# Patient Record
Sex: Male | Born: 1980 | Race: White | Hispanic: No | Marital: Single | Smoking: Former smoker
Health system: Southern US, Community
[De-identification: ages and names within clinical notes are randomized; demographics above are authoritative.]

## PROBLEM LIST (undated history)

## (undated) DIAGNOSIS — N200 Calculus of kidney: Secondary | ICD-10-CM

## (undated) DIAGNOSIS — I456 Pre-excitation syndrome: Secondary | ICD-10-CM

## (undated) DIAGNOSIS — R002 Palpitations: Secondary | ICD-10-CM

## (undated) DIAGNOSIS — Z720 Tobacco use: Secondary | ICD-10-CM

## (undated) DIAGNOSIS — R079 Chest pain, unspecified: Secondary | ICD-10-CM

## (undated) DIAGNOSIS — F419 Anxiety disorder, unspecified: Secondary | ICD-10-CM

## (undated) DIAGNOSIS — R51 Headache: Secondary | ICD-10-CM

## (undated) DIAGNOSIS — R1310 Dysphagia, unspecified: Secondary | ICD-10-CM

## (undated) DIAGNOSIS — R519 Headache, unspecified: Secondary | ICD-10-CM

## (undated) DIAGNOSIS — H539 Unspecified visual disturbance: Secondary | ICD-10-CM

## (undated) HISTORY — DX: Palpitations: R00.2

## (undated) HISTORY — PX: ESOPHAGOGASTRODUODENOSCOPY: SHX1529

## (undated) HISTORY — DX: Chest pain, unspecified: R07.9

## (undated) HISTORY — DX: Calculus of kidney: N20.0

## (undated) HISTORY — DX: Headache: R51

## (undated) HISTORY — PX: STRABISMUS SURGERY: SHX218

## (undated) HISTORY — DX: Unspecified visual disturbance: H53.9

## (undated) HISTORY — DX: Headache, unspecified: R51.9

---

## 2015-04-29 ENCOUNTER — Emergency Department: Payer: Self-pay

## 2015-04-29 ENCOUNTER — Emergency Department
Admission: EM | Admit: 2015-04-29 | Discharge: 2015-04-29 | Disposition: A | Discharge status: Home / Self Care | Payer: Self-pay | Attending: Emergency Medicine | Admitting: Emergency Medicine

## 2015-04-29 ENCOUNTER — Encounter: Payer: Self-pay | Admitting: Emergency Medicine

## 2015-04-29 DIAGNOSIS — R42 Dizziness and giddiness: Secondary | ICD-10-CM | POA: Insufficient documentation

## 2015-04-29 DIAGNOSIS — R200 Anesthesia of skin: Secondary | ICD-10-CM | POA: Insufficient documentation

## 2015-04-29 DIAGNOSIS — Z720 Tobacco use: Secondary | ICD-10-CM | POA: Insufficient documentation

## 2015-04-29 DIAGNOSIS — R110 Nausea: Secondary | ICD-10-CM | POA: Insufficient documentation

## 2015-04-29 DIAGNOSIS — R079 Chest pain, unspecified: Secondary | ICD-10-CM | POA: Insufficient documentation

## 2015-04-29 DIAGNOSIS — R531 Weakness: Secondary | ICD-10-CM | POA: Insufficient documentation

## 2015-04-29 LAB — URINE DRUG SCREEN, QUALITATIVE (ARMC ONLY)
Amphetamines, Ur Screen: NOT DETECTED
BARBITURATES, UR SCREEN: NOT DETECTED
BENZODIAZEPINE, UR SCRN: NOT DETECTED
CANNABINOID 50 NG, UR ~~LOC~~: NOT DETECTED
Cocaine Metabolite,Ur ~~LOC~~: NOT DETECTED
MDMA (Ecstasy)Ur Screen: NOT DETECTED
METHADONE SCREEN, URINE: NOT DETECTED
Opiate, Ur Screen: NOT DETECTED
Phencyclidine (PCP) Ur S: NOT DETECTED
TRICYCLIC, UR SCREEN: NOT DETECTED

## 2015-04-29 LAB — BASIC METABOLIC PANEL
ANION GAP: 12 (ref 5–15)
BUN: 16 mg/dL (ref 6–20)
CALCIUM: 11.2 mg/dL — AB (ref 8.9–10.3)
CO2: 26 mmol/L (ref 22–32)
Chloride: 102 mmol/L (ref 101–111)
Creatinine, Ser: 0.99 mg/dL (ref 0.61–1.24)
Glucose, Bld: 102 mg/dL — ABNORMAL HIGH (ref 65–99)
POTASSIUM: 3.1 mmol/L — AB (ref 3.5–5.1)
SODIUM: 140 mmol/L (ref 135–145)

## 2015-04-29 LAB — CBC
HCT: 49.1 % (ref 40.0–52.0)
Hemoglobin: 16.8 g/dL (ref 13.0–18.0)
MCH: 32.6 pg (ref 26.0–34.0)
MCHC: 34.2 g/dL (ref 32.0–36.0)
MCV: 95.4 fL (ref 80.0–100.0)
PLATELETS: 201 10*3/uL (ref 150–440)
RBC: 5.15 MIL/uL (ref 4.40–5.90)
RDW: 13.4 % (ref 11.5–14.5)
WBC: 9.9 10*3/uL (ref 3.8–10.6)

## 2015-04-29 LAB — ETHANOL

## 2015-04-29 LAB — TROPONIN I
Troponin I: <0.03 ng/mL (ref <0.031)
Troponin I: <0.03 ng/mL (ref <0.031)

## 2015-04-29 MED ORDER — SODIUM CHLORIDE 0.9 % IV BOLUS (SEPSIS)
500.0000 mL | Freq: Once | INTRAVENOUS | Status: AC
  Administered 2015-04-29: 500 mL via INTRAVENOUS

## 2015-04-29 MED ORDER — ZIPRASIDONE MESYLATE 20 MG IM SOLR
INTRAMUSCULAR | Status: AC
Start: 2015-04-29 — End: 2015-04-30
  Filled 2015-04-29: qty 20

## 2015-04-29 MED ORDER — POTASSIUM CHLORIDE CRYS ER 20 MEQ PO TBCR
40.0000 meq | EXTENDED_RELEASE_TABLET | Freq: Once | ORAL | Status: AC
  Administered 2015-04-29: 40 meq via ORAL
  Filled 2015-04-29: qty 2

## 2015-04-29 MED ORDER — PANTOPRAZOLE SODIUM 40 MG IV SOLR
40.0000 mg | Freq: Once | INTRAVENOUS | Status: AC
  Administered 2015-04-29: 40 mg via INTRAVENOUS
  Filled 2015-04-29: qty 40

## 2015-04-29 NOTE — ED Notes (Signed)
Lab notified to add ethanol, spoke with Jon Steele, states will add.

## 2015-04-29 NOTE — ED Notes (Signed)
Patient transported to X-ray 

## 2015-04-29 NOTE — ED Notes (Signed)
Pt in with co nausea, chest pain, and dizziness since tonight.

## 2015-04-29 NOTE — Discharge Instructions (Signed)
1. Please call number provided to schedule an appointment with the cardiologist. 2. Return to the ER for recurrent or worsening symptoms, persistent vomiting, difficulty breathing or other concerns.  Chest Pain (Nonspecific) It is often hard to give a specific diagnosis for the cause of chest pain. There is always a chance that your pain could be related to something serious, such as a heart attack or a blood clot in the lungs. You need to follow up with your health care provider for further evaluation. CAUSES   Heartburn.  Pneumonia or bronchitis.  Anxiety or stress.  Inflammation around your heart (pericarditis) or lung (pleuritis or pleurisy).  A blood clot in the lung.  A collapsed lung (pneumothorax). It can develop suddenly on its own (spontaneous pneumothorax) or from trauma to the chest.  Shingles infection (herpes zoster virus). The chest wall is composed of bones, muscles, and cartilage. Any of these can be the source of the pain.  The bones can be bruised by injury.  The muscles or cartilage can be strained by coughing or overwork.  The cartilage can be affected by inflammation and become sore (costochondritis). DIAGNOSIS  Lab tests or other studies may be needed to find the cause of your pain. Your health care provider may have you take a test called an ambulatory electrocardiogram (ECG). An ECG records your heartbeat patterns over a 24-hour period. You may also have other tests, such as:  Transthoracic echocardiogram (TTE). During echocardiography, sound waves are used to evaluate how blood flows through your heart.  Transesophageal echocardiogram (TEE).  Cardiac monitoring. This allows your health care provider to monitor your heart rate and rhythm in real time.  Holter monitor. This is a portable device that records your heartbeat and can help diagnose heart arrhythmias. It allows your health care provider to track your heart activity for several days, if  needed.  Stress tests by exercise or by giving medicine that makes the heart beat faster. TREATMENT   Treatment depends on what may be causing your chest pain. Treatment may include:  Acid blockers for heartburn.  Anti-inflammatory medicine.  Pain medicine for inflammatory conditions.  Antibiotics if an infection is present.  You may be advised to change lifestyle habits. This includes stopping smoking and avoiding alcohol, caffeine, and chocolate.  You may be advised to keep your head raised (elevated) when sleeping. This reduces the chance of acid going backward from your stomach into your esophagus. Most of the time, nonspecific chest pain will improve within 2-3 days with rest and mild pain medicine.  HOME CARE INSTRUCTIONS   If antibiotics were prescribed, take them as directed. Finish them even if you start to feel better.  For the next few days, avoid physical activities that bring on chest pain. Continue physical activities as directed.  Do not use any tobacco products, including cigarettes, chewing tobacco, or electronic cigarettes.  Avoid drinking alcohol.  Only take medicine as directed by your health care provider.  Follow your health care provider's suggestions for further testing if your chest pain does not go away.  Keep any follow-up appointments you made. If you do not go to an appointment, you could develop lasting (chronic) problems with pain. If there is any problem keeping an appointment, call to reschedule. SEEK MEDICAL CARE IF:   Your chest pain does not go away, even after treatment.  You have a rash with blisters on your chest.  You have a fever. SEEK IMMEDIATE MEDICAL CARE IF:   You have  increased chest pain or pain that spreads to your arm, neck, jaw, back, or abdomen.  You have shortness of breath.  You have an increasing cough, or you cough up blood.  You have severe back or abdominal pain.  You feel nauseous or vomit.  You have severe  weakness.  You faint.  You have chills. This is an emergency. Do not wait to see if the pain will go away. Get medical help at once. Call your local emergency services (911 in [place] Do not drive yourself to the hospital. MAKE SURE YOU:   Understand these instructions.  Will watch your condition.  Will get help right away if you are not doing well or get worse. Document Released: 04/25/2005 Document Revised: 07/21/2013 Document Reviewed: 02/19/2008 [facility] Patient Information 2015 [place], [place]. This information is not intended to replace advice given to you by your health care provider. Make sure you discuss any questions you have with your health care provider.

## 2015-04-29 NOTE — ED Provider Notes (Signed)
[facility] Emergency Department Provider Note  ____________________________________________  Time seen: Approximately 3:20 AM  I have reviewed the triage vital signs and the nursing notes.   HISTORY  Chief Complaint Chest Pain    HPI Jon Steele is a 34 y.o. male who presents to the ED from home with a chief complain of chest pain. Onset of left-sided chest discomfort approximately midnight while working at his computer. Symptoms associated with nausea and dizziness. Similar symptoms previously which were not evaluated. Patient also notes some heartburn this evening. Denies recent fever, chills, cough, congestion, abdominal pain, vomiting, diarrhea. Denies recent travel or illicit drug use. Denies hormone use. Denies use of energy drinks or stimulants. Denies recent strenuous exercise or use of muscle supplements. Nothing makes the discomfort better or worse.  Past medical history None   There are no active problems to display for this patient.   Past surgical history Eye surgery   No current outpatient prescriptions on file.  Allergies Review of patient's allergies indicates no known allergies.  Family history None for CAD Maternal grandmother with diabetes   Social History Social History  Substance Use Topics  . Smoking status: Current Every Day Smoker -- 1.00 packs/day    Types: Cigarettes  . Smokeless tobacco: Never Used  . Alcohol Use: None     Comment: daily, beers    Review of Systems Constitutional: No fever/chills Eyes: No visual changes. ENT: No sore throat. Cardiovascular: Positive for chest pain. Respiratory: Denies shortness of breath. Gastrointestinal: No abdominal pain.  Positive for nausea, no vomiting.  No diarrhea.  No constipation. Genitourinary: Negative for dysuria. Musculoskeletal: Negative for back pain. Skin: Negative for rash. Neurological: Positive for dizziness, especially upon standing quickly. Negative  for headaches, focal weakness or numbness.  10-point ROS otherwise negative.  ____________________________________________   PHYSICAL EXAM:  VITAL SIGNS: ED Triage Vitals  Enc Vitals Group     BP 04/29/15 0222 167/93 mmHg     Pulse Rate 04/29/15 0222 98     Resp 04/29/15 0222 18     Temp 04/29/15 0222 98.4 F (36.9 C)     Temp Source 04/29/15 0222 Oral     SpO2 04/29/15 0222 97 %     Weight 04/29/15 0222 160 lb (72.576 kg)     Height 04/29/15 0222  (1.778 m)     Head Cir --      Peak Flow --      Pain Score 04/29/15 0223 2     Pain Loc --      Pain Edu? --      Excl. in GC? --     Constitutional: Alert and oriented. Well appearing and in no acute distress. Eyes: Conjunctivae are normal. PERRL. EOMI. Head: Atraumatic. Nose: No congestion/rhinnorhea. Mouth/Throat: Mucous membranes are moist.  Oropharynx non-erythematous. Neck: No stridor.   Cardiovascular: Normal rate, regular rhythm. Grossly normal heart sounds.  Good peripheral circulation. Respiratory: Normal respiratory effort.  No retractions. Lungs CTAB. Gastrointestinal: Soft and nontender. No distention. No abdominal bruits. No CVA tenderness. Musculoskeletal: No lower extremity tenderness nor edema.  No joint effusions. Neurologic:  Normal speech and language. No gross focal neurologic deficits are appreciated. No gait instability. Skin:  Skin is warm, dry and intact. No rash noted. Psychiatric: Mood and affect are normal. Speech and behavior are normal.  ____________________________________________   LABS (all labs ordered are listed, but only abnormal results are displayed)  Labs Reviewed  BASIC METABOLIC PANEL - Abnormal; Notable for  the following:    Potassium 3.1 (*)    Glucose, Bld 102 (*)    Calcium 11.2 (*)    All other components within normal limits  CBC  TROPONIN I  ETHANOL  URINE DRUG SCREEN, QUALITATIVE ([facility] ONLY)  TROPONIN I    ____________________________________________  EKG  ED ECG REPORT I, SUNG,JADE J, the attending physician, personally viewed and interpreted this ECG.   Date: 04/29/2015  EKG Time: 0227  Rate: 85  Rhythm: normal EKG, normal sinus rhythm  Axis: Normal  Intervals:none  ST&T Change: Nonspecific  ____________________________________________  RADIOLOGY  Chest 2 view (viewed by me, interpreted per Dr. Karie Kirks): Normal chest. ____________________________________________   PROCEDURES  Procedure(s) performed: None  Critical Care performed: No  ____________________________________________   INITIAL IMPRESSION / ASSESSMENT AND PLAN / ED COURSE  Pertinent labs & imaging results that were available during my care of the patient were reviewed by me and considered in my medical decision making (see chart for details).  34 year old male who presents with chest discomfort. Patient has no past medical history and there is no family history of CAD; low risk for ACS. Low suspicion for PE given no recent travel, hypoxia or calf tenderness. Will obtain screening lab work including troponin, EKG, chest x-ray and reassess. Will give IV Protonix for sensation of reflux.  ----------------------------------------- 4:29 AM on 04/29/2015 -----------------------------------------  Patient resting in no acute distress. Feeling better. Will repeat 3 hour troponin. Remains hemodynamically stable.  ----------------------------------------- 6:08 AM on 04/29/2015 -----------------------------------------  Repeat troponin negative. Discussed with patient and father; will follow-up cardiology outpatient. Strict return return precautions given. Both verbalize understanding and agree with plan of care. ____________________________________________   FINAL CLINICAL IMPRESSION(S) / ED DIAGNOSES  Final diagnoses:  Chest pain, unspecified chest pain type      Irean Hong, MD 04/29/15 [id number]

## 2015-06-09 ENCOUNTER — Emergency Department: Payer: Self-pay

## 2015-06-09 ENCOUNTER — Encounter: Payer: Self-pay | Admitting: Emergency Medicine

## 2015-06-09 ENCOUNTER — Observation Stay
Admission: EM | Admit: 2015-06-09 | Discharge: 2015-06-10 | Disposition: A | Discharge status: Home / Self Care | Payer: Self-pay | Attending: Internal Medicine | Admitting: Internal Medicine

## 2015-06-09 ENCOUNTER — Observation Stay
Admit: 2015-06-09 | Discharge: 2015-06-09 | Disposition: A | Discharge status: Home / Self Care | Payer: Self-pay | Attending: Cardiology | Admitting: Cardiology

## 2015-06-09 DIAGNOSIS — I071 Rheumatic tricuspid insufficiency: Secondary | ICD-10-CM | POA: Insufficient documentation

## 2015-06-09 DIAGNOSIS — I10 Essential (primary) hypertension: Secondary | ICD-10-CM | POA: Insufficient documentation

## 2015-06-09 DIAGNOSIS — I456 Pre-excitation syndrome: Secondary | ICD-10-CM | POA: Insufficient documentation

## 2015-06-09 DIAGNOSIS — K219 Gastro-esophageal reflux disease without esophagitis: Secondary | ICD-10-CM | POA: Insufficient documentation

## 2015-06-09 DIAGNOSIS — R0602 Shortness of breath: Secondary | ICD-10-CM | POA: Insufficient documentation

## 2015-06-09 DIAGNOSIS — R079 Chest pain, unspecified: Principal | ICD-10-CM | POA: Insufficient documentation

## 2015-06-09 DIAGNOSIS — R5383 Other fatigue: Secondary | ICD-10-CM | POA: Insufficient documentation

## 2015-06-09 DIAGNOSIS — Z832 Family history of diseases of the blood and blood-forming organs and certain disorders involving the immune mechanism: Secondary | ICD-10-CM | POA: Insufficient documentation

## 2015-06-09 DIAGNOSIS — F17210 Nicotine dependence, cigarettes, uncomplicated: Secondary | ICD-10-CM | POA: Insufficient documentation

## 2015-06-09 DIAGNOSIS — F419 Anxiety disorder, unspecified: Secondary | ICD-10-CM | POA: Insufficient documentation

## 2015-06-09 DIAGNOSIS — R011 Cardiac murmur, unspecified: Secondary | ICD-10-CM | POA: Insufficient documentation

## 2015-06-09 DIAGNOSIS — Z79899 Other long term (current) drug therapy: Secondary | ICD-10-CM | POA: Insufficient documentation

## 2015-06-09 HISTORY — DX: Tobacco use: Z72.0

## 2015-06-09 HISTORY — DX: Pre-excitation syndrome: I45.6

## 2015-06-09 HISTORY — DX: Dysphagia, unspecified: R13.10

## 2015-06-09 HISTORY — DX: Anxiety disorder, unspecified: F41.9

## 2015-06-09 LAB — BASIC METABOLIC PANEL
Anion gap: 7 (ref 5–15)
BUN: 16 mg/dL (ref 6–20)
CALCIUM: 10 mg/dL (ref 8.9–10.3)
CHLORIDE: 101 mmol/L (ref 101–111)
CO2: 30 mmol/L (ref 22–32)
CREATININE: 0.96 mg/dL (ref 0.61–1.24)
GFR calc non Af Amer: >60 mL/min (ref >60)
Glucose, Bld: 103 mg/dL — ABNORMAL HIGH (ref 65–99)
Potassium: 3.4 mmol/L — ABNORMAL LOW (ref 3.5–5.1)
SODIUM: 138 mmol/L (ref 135–145)

## 2015-06-09 LAB — FIBRIN DERIVATIVES D-DIMER (ARMC ONLY): Fibrin derivatives D-dimer (ARMC): 179 (ref 0–499)

## 2015-06-09 LAB — CBC
HCT: 46.6 % (ref 40.0–52.0)
HEMOGLOBIN: 15.9 g/dL (ref 13.0–18.0)
MCH: 32.2 pg (ref 26.0–34.0)
MCHC: 34.2 g/dL (ref 32.0–36.0)
MCV: 94.2 fL (ref 80.0–100.0)
Platelets: 169 10*3/uL (ref 150–440)
RBC: 4.94 MIL/uL (ref 4.40–5.90)
RDW: 12.7 % (ref 11.5–14.5)
WBC: 6.2 10*3/uL (ref 3.8–10.6)

## 2015-06-09 LAB — TROPONIN I

## 2015-06-09 MED ORDER — ENOXAPARIN SODIUM 40 MG/0.4ML ~~LOC~~ SOLN
40.0000 mg | SUBCUTANEOUS | Status: DC
  Administered 2015-06-09 – 2015-06-10 (×2): 40 mg via SUBCUTANEOUS
  Filled 2015-06-09 (×4): qty 0.4

## 2015-06-09 MED ORDER — POLYETHYLENE GLYCOL 3350 17 G PO PACK: 17.0000 g | PACK | Freq: Every day | ORAL | Status: DC | PRN

## 2015-06-09 MED ORDER — ONDANSETRON HCL 4 MG PO TABS: 4.0000 mg | ORAL_TABLET | Freq: Four times a day (QID) | ORAL | Status: DC | PRN

## 2015-06-09 MED ORDER — ACETAMINOPHEN 650 MG RE SUPP: 650.0000 mg | Freq: Four times a day (QID) | RECTAL | Status: DC | PRN

## 2015-06-09 MED ORDER — ONDANSETRON HCL 4 MG/2ML IJ SOLN: 4.0000 mg | Freq: Four times a day (QID) | INTRAMUSCULAR | Status: DC | PRN

## 2015-06-09 MED ORDER — DOCUSATE SODIUM 100 MG PO CAPS
100.0000 mg | ORAL_CAPSULE | Freq: Two times a day (BID) | ORAL | Status: DC
Start: 2015-06-09 — End: 2015-06-10
  Administered 2015-06-09 – 2015-06-10 (×3): 100 mg via ORAL
  Filled 2015-06-09 (×3): qty 1

## 2015-06-09 MED ORDER — ACETAMINOPHEN 325 MG PO TABS: 650.0000 mg | ORAL_TABLET | Freq: Four times a day (QID) | ORAL | Status: DC | PRN

## 2015-06-09 MED ORDER — SODIUM CHLORIDE 0.9 % IV SOLN: 250.0000 mL | INTRAVENOUS | Status: DC | PRN

## 2015-06-09 MED ORDER — SODIUM CHLORIDE 0.9 % IJ SOLN: 3.0000 mL | INTRAMUSCULAR | Status: DC | PRN

## 2015-06-09 MED ORDER — SODIUM CHLORIDE 0.9 % IJ SOLN
3.0000 mL | Freq: Two times a day (BID) | INTRAMUSCULAR | Status: DC
  Administered 2015-06-09: 3 mL via INTRAVENOUS

## 2015-06-09 MED ORDER — ALBUTEROL SULFATE (2.5 MG/3ML) 0.083% IN NEBU: 2.5000 mg | INHALATION_SOLUTION | RESPIRATORY_TRACT | Status: DC | PRN

## 2015-06-09 MED ORDER — ASPIRIN EC 81 MG PO TBEC
81.0000 mg | DELAYED_RELEASE_TABLET | Freq: Every day | ORAL | Status: DC
  Administered 2015-06-10: 81 mg via ORAL
  Filled 2015-06-09: qty 1

## 2015-06-09 MED ORDER — SODIUM CHLORIDE 0.9 % IJ SOLN
3.0000 mL | Freq: Two times a day (BID) | INTRAMUSCULAR | Status: DC
  Administered 2015-06-09 – 2015-06-10 (×2): 3 mL via INTRAVENOUS

## 2015-06-09 MED ORDER — ASPIRIN 81 MG PO CHEW
324.0000 mg | CHEWABLE_TABLET | Freq: Once | ORAL | Status: AC
  Administered 2015-06-09: 324 mg via ORAL
  Filled 2015-06-09: qty 4

## 2015-06-09 MED ORDER — HYDROCODONE-ACETAMINOPHEN 5-325 MG PO TABS
1.0000 | ORAL_TABLET | ORAL | Status: DC | PRN
  Administered 2015-06-09: 1 via ORAL
  Administered 2015-06-09 – 2015-06-10 (×4): 2 via ORAL
  Filled 2015-06-09 (×3): qty 2
  Filled 2015-06-09: qty 1
  Filled 2015-06-09: qty 2

## 2015-06-09 NOTE — Consult Note (Signed)
[facility] Cardiology  CARDIOLOGY CONSULT NOTE  Patient ID: Kinta Martis MRN: [id number] DOB/AGE: 34-Oct-1982 34 y.o.  Admit date: 06/09/2015 Referring Physician Sudini Primary Physician Reather Littler Primary Cardiologist  Reason for Consultation chest pain  HPI: 34 year old male referred for evaluation of chest pain. Patient apparently was in his usual state of health until approximately 6 weeks ago when he started to experience episodes of substernal chest discomfort, with radiation to his left shoulder and arm, associated with weakness, shortness of breath, apically with exertion but also occurring after meals. She was seen in [facility] emergency room, and was evaluated by Dr. Juliann Pares as an outpatient. The patient was scheduled for outpatient echocardiogram and stress test which was not performed. In the meantime, the patient has undergone barium swallow and upper endoscopy, with biopsy, results pending. Patient returned today to the emergency room with current chest discomfort following a shower. EKG reveals sinus rhythm with short PR interval and delta wave, consistent with Wolff-Parkinson-White syndrome. The patient denies a history of presyncope or syncope though occasionally does experience palpitations.  Review of systems complete and found to be negative unless listed above     Past Medical History  Diagnosis Date  . Tobacco abuse   . Dysphagia   . Hypertension   . Anxiety     Past Surgical History  Procedure Laterality Date  . Strabismus surgery      Prescriptions prior to admission  Medication Sig Dispense Refill Last Dose  . atenolol (TENORMIN) 25 MG tablet Take 25 mg by mouth every evening.   06/08/2015 at 1820  . busPIRone (BUSPAR) 10 MG tablet Take 10 mg by mouth daily.   06/08/2015 at am  . pantoprazole (PROTONIX) 40 MG tablet Take 40 mg by mouth 2 (two) times daily before a meal.   06/08/2015 at pm   Social History   Social History  . Marital Status: Single    Spouse Name: N/A   . Number of Children: N/A  . Years of Education: N/A   Occupational History  . Not on file.   Social History Main Topics  . Smoking status: Current Every Day Smoker -- 1.00 packs/day    Types: Cigarettes  . Smokeless tobacco: Never Used  . Alcohol Use: Not on file     Comment: daily, beers  . Drug Use: No  . Sexual Activity: Yes   Other Topics Concern  . Not on file   Social History Narrative    Family History  Problem Relation Age of Onset  . Clotting disorder Father       Review of systems complete and found to be negative unless listed above      PHYSICAL EXAM  General: Well developed, well nourished, in no acute distress HEENT:  Normocephalic and atramatic Neck:  No JVD.  Lungs: Clear bilaterally to auscultation and percussion. Heart: HRRR . Normal S1 and S2 without gallops or murmurs.  Abdomen: Bowel sounds are positive, abdomen soft and non-tender  Msk:  Back normal, normal gait. Normal strength and tone for age. Extremities: No clubbing, cyanosis or edema.   Neuro: Alert and oriented X 3. Psych:  Good affect, responds appropriately  Labs:   Lab Results  Component Value Date   WBC 6.2 06/09/2015   HGB 15.9 06/09/2015   HCT 46.6 06/09/2015   MCV 94.2 06/09/2015   PLT 169 06/09/2015    Recent Labs Lab 06/09/15 0601  NA 138  K 3.4*  CL 101  CO2 30  BUN 16  CREATININE 0.96  CALCIUM 10.0  GLUCOSE 103*   Lab Results  Component Value Date   TROPONINI <0.03 06/09/2015   No results found for: CHOL No results found for: HDL No results found for: LDLCALC No results found for: TRIG No results found for: CHOLHDL No results found for: LDLDIRECT    Radiology: Dg Chest 2 View  06/09/2015  CLINICAL DATA:  Initial evaluation for acute left-sided chest pain, shortness of breath. EXAM: CHEST  2 VIEW COMPARISON:  Prior radiograph from 05/18/2015. FINDINGS: The cardiac and mediastinal silhouettes are stable in size and contour, and remain within normal  limits. The lungs are normally inflated. No airspace consolidation, pleural effusion, or pulmonary edema is identified. There is no pneumothorax. No acute osseous abnormality identified. IMPRESSION: No active cardiopulmonary disease. Electronically Signed   By: Rise MuBenjamin  McClintock M.D.   On: 06/09/2015 06:01    EKG: Normal sinus rhythm with short PR interval and delta wave consistent with Wolff-Parkinson-White  ASSESSMENT AND PLAN:   1. Recurrent episodes of exertional chest pain with shortness of breath 2. ECG consistent with Wolff-Parkinson-White syndrome  Recommendations  1. 2-D echocardiogram 2. ETT sestamibi study in the a.m. 3. Defer full dose anticoagulation   Signed: Tiffnay Bossi MD,PhD, [facility] 06/09/2015, 1:32 PM

## 2015-06-09 NOTE — H&P (Signed)
[facility] - [place] at [facility]   PATIENT NAME: Jon Steele    MR#:  [id number]  DATE OF BIRTH:  12-Oct-1980  DATE OF ADMISSION:  06/09/2015  PRIMARY CARE PHYSICIAN: Pricilla Holm, MD   REQUESTING/REFERRING PHYSICIAN: Dr. Inocencio Homes  CHIEF COMPLAINT:   Chief Complaint  Patient presents with  . Shortness of Breath    HISTORY OF PRESENT ILLNESS:  Jon Steele  is a 34 y.o. male with a known history of retention, GERD presents to the emergency room complaining of left chest pain radiating to left arm. Symptoms started 6 weeks prior along with shortness of breath. Extreme fatigue. Symptoms have been slowly worsening. No nausea, lightheadedness, syncope, palpitations. Patient was seen by Dr. Juliann Pares of cardiology and scheduled for an outpatient stress test. Patient was also seen by GI and neurology and had endoscopy which was normal. Biopsy results pending. Due to worsening symptoms patient is being admitted under observation to rule out acute coronary syndrome. Kartik enzymes normal.  PAST MEDICAL HISTORY:  History reviewed. No pertinent past medical history.  PAST SURGICAL HISTORY:   Past Surgical History  Procedure Laterality Date  . Strabismus surgery      SOCIAL HISTORY:   Social History  Substance Use Topics  . Smoking status: Current Every Day Smoker -- 1.00 packs/day    Types: Cigarettes  . Smokeless tobacco: Never Used  . Alcohol Use: Not on file     Comment: daily, beers    FAMILY HISTORY:  No family history on file.  DRUG ALLERGIES:  No Known Allergies  REVIEW OF SYSTEMS:   Review of Systems  Constitutional: Positive for malaise/fatigue. Negative for fever, chills and weight loss.  HENT: Negative for hearing loss and nosebleeds.   Eyes: Negative for blurred vision, double vision and pain.  Respiratory: Negative for cough, hemoptysis, sputum production, shortness of breath and wheezing.   Cardiovascular: Positive for chest  pain. Negative for palpitations, orthopnea and leg swelling.  Gastrointestinal: Positive for abdominal pain. Negative for nausea, vomiting, diarrhea and constipation.  Genitourinary: Negative for dysuria and hematuria.  Musculoskeletal: Negative for myalgias, back pain and falls.  Skin: Negative for rash.  Neurological: Positive for weakness. Negative for dizziness, tremors, sensory change, speech change, focal weakness, seizures and headaches.  Endo/Heme/Allergies: Does not bruise/bleed easily.  Psychiatric/Behavioral: Negative for depression and memory loss. The patient is nervous/anxious.     MEDICATIONS AT HOME:   Prior to Admission medications   Medication Sig Start Date End Date Taking? Authorizing Provider  atenolol (TENORMIN) 25 MG tablet Take 25 mg by mouth every evening.   Yes Historical Provider, MD  busPIRone (BUSPAR) 10 MG tablet Take 10 mg by mouth daily.   Yes Historical Provider, MD  pantoprazole (PROTONIX) 40 MG tablet Take 40 mg by mouth 2 (two) times daily before a meal.   Yes Historical Provider, MD      VITAL SIGNS:  Blood pressure 117/67, pulse 63, temperature 98.1 F (36.7 C), temperature source Oral, resp. rate 14, height  (1.778 m), weight 72.576 kg (160 lb), SpO2 100 %.  PHYSICAL EXAMINATION:  Physical Exam  GENERAL:  34 y.o.-year-old patient lying in the bed with no acute distress.  EYES: Pupils equal, round, reactive to light and accommodation. No scleral icterus. Extraocular muscles intact.  HEENT: Head atraumatic, normocephalic. Oropharynx and nasopharynx clear. No oropharyngeal erythema, moist oral mucosa  NECK:  Supple, no jugular venous distention. No thyroid enlargement, no tenderness.  LUNGS: Normal breath sounds  bilaterally, no wheezing, rales, rhonchi. No use of accessory muscles of respiration. Left chest tenderness  CARDIOVASCULAR: S1, S2 normal. No murmurs, rubs, or gallops.  ABDOMEN: Soft, nontender, nondistended. Bowel sounds present.  No organomegaly or mass.  EXTREMITIES: No pedal edema, cyanosis, or clubbing. + 2 pedal & radial pulses b/l.   NEUROLOGIC: Cranial nerves II through XII are intact. No focal Motor or sensory deficits appreciated b/l PSYCHIATRIC: The patient is alert and oriented x 3. Good affect.  SKIN: No obvious rash, lesion, or ulcer.   LABORATORY PANEL:   CBC  Recent Labs Lab 06/09/15 0601  WBC 6.2  HGB 15.9  HCT 46.6  PLT 169   ------------------------------------------------------------------------------------------------------------------  Chemistries   Recent Labs Lab 06/09/15 0601  NA 138  K 3.4*  CL 101  CO2 30  GLUCOSE 103*  BUN 16  CREATININE 0.96  CALCIUM 10.0   ------------------------------------------------------------------------------------------------------------------  Cardiac Enzymes  Recent Labs Lab 06/09/15 0601  TROPONINI <0.03   ------------------------------------------------------------------------------------------------------------------  RADIOLOGY:  Dg Chest 2 View  06/09/2015  CLINICAL DATA:  Initial evaluation for acute left-sided chest pain, shortness of breath. EXAM: CHEST  2 VIEW COMPARISON:  Prior radiograph from 05/18/2015. FINDINGS: The cardiac and mediastinal silhouettes are stable in size and contour, and remain within normal limits. The lungs are normally inflated. No airspace consolidation, pleural effusion, or pulmonary edema is identified. There is no pneumothorax. No acute osseous abnormality identified. IMPRESSION: No active cardiopulmonary disease. Electronically Signed   By: Rise MuBenjamin  McClintock M.D.   On: 06/09/2015 06:01     IMPRESSION AND PLAN:   * Left chest pain Etiology is not clear at this point. Associated with shortness of breath. Patient has been on PPI and also had EGD which were normal. Cardiac enzymes normal. Discussed with Dr. Adrienne MochaParaschoes of cardiology. May need stress test.  * Anxiety  * GERD  * DVT  prophylaxis with Lovenox  All the records are reviewed and case discussed with ED provider. Management plans discussed with the patient, family and they are in agreement.  CODE STATUS: FULL  TOTAL TIME TAKING CARE OF THIS PATIENT: 40 minutes.    Milagros LollSudini, Wilton Thrall R M.D on 06/09/2015 at 12:28 PM  Between 7am to 6pm - Pager - [phone]  After 6pm go to [email or web address] - password [email or web address] [email or web address]  [facility]  Office  [phone]  CC: Primary care physician; Pricilla HolmSHARPE, LESLIE M, MD  Note: This dictation was prepared with Dragon dictation along with smaller phrase technology. Any transcriptional errors that result from this process are unintentional.

## 2015-06-09 NOTE — ED Notes (Signed)
Patient ambulatory to triage with steady gait, without difficulty or distress noted; pt reports awakening with "cool warmness and shortness of breath"; denies pain; st hx of same and has been referred to GI with subsequent tests

## 2015-06-09 NOTE — Progress Notes (Signed)
*  PRELIMINARY RESULTS* Echocardiogram 2D Echocardiogram has been performed.  Jon Steele 06/09/2015, 4:12 PM

## 2015-06-09 NOTE — ED Provider Notes (Signed)
[facility] Emergency Department Provider Note  ____________________________________________  Time seen: Approximately 7:16 AM  I have reviewed the triage vital signs and the nursing notes.   HISTORY  Chief Complaint Shortness of Breath    HPI Jon Steele is a 34 y.o. male with history of hypertension, tobacco use who presents for evaluation of chest pain and shortness of breath this morning, gradual onset, constant since onset, worse with exertion, currently mild. She reports that his pain often radiates into the left arm. He has had worsening dyspnea is with exertion over the past several weeks, now feeling winded just with routine activities such as showering. He was seen by Dr. Juliann Pares several weeks ago for chest pain and was noted to have a murmur on exam. stress test and echocardiogram were recommended due to concern for possible unstable angina however have not been performed. He has undergone extensive GI workup for this without a specific GI source found. No cough, sneezing, runny nose, congestion, vomiting, diarrhea, fevers or chills.   History reviewed. No pertinent past medical history.  Patient Active Problem List   Diagnosis Date Noted  . Chest pain 06/09/2015    Past Surgical History  Procedure Laterality Date  . Strabismus surgery      Current Outpatient Rx  Name  Route  Sig  Dispense  Refill  . atenolol (TENORMIN) 25 MG tablet   Oral   Take 25 mg by mouth every evening.         . busPIRone (BUSPAR) 10 MG tablet   Oral   Take 10 mg by mouth daily.         . pantoprazole (PROTONIX) 40 MG tablet   Oral   Take 40 mg by mouth 2 (two) times daily before a meal.           Allergies Review of patient's allergies indicates no known allergies.  No family history on file.  Social History Social History  Substance Use Topics  . Smoking status: Current Every Day Smoker -- 1.00 packs/day    Types: Cigarettes  . Smokeless  tobacco: Never Used  . Alcohol Use: None     Comment: daily, beers    Review of Systems Constitutional: No fever/chills Eyes: No visual changes. ENT: No sore throat. Cardiovascular: +chest pain. Respiratory: + shortness of breath. Gastrointestinal: No abdominal pain.  No nausea, no vomiting.  No diarrhea.  No constipation. Genitourinary: Negative for dysuria. Musculoskeletal: Negative for back pain. Skin: Negative for rash. Neurological: Negative for headaches, focal weakness or numbness.  10-point ROS otherwise negative.  ____________________________________________   PHYSICAL EXAM:  VITAL SIGNS: ED Triage Vitals  Enc Vitals Group     BP 06/09/15 0539 129/82 mmHg     Pulse Rate 06/09/15 0539 86     Resp 06/09/15 0539 18     Temp 06/09/15 0539 98 F (36.7 C)     Temp Source 06/09/15 0539 Oral     SpO2 06/09/15 0539 100 %     Weight 06/09/15 0539 160 lb (72.576 kg)     Height 06/09/15 0539  (1.778 m)     Head Cir --      Peak Flow --      Pain Score --      Pain Loc --      Pain Edu? --      Excl. in GC? --     Constitutional: Alert and oriented. Well appearing and in no acute distress. Eyes: Conjunctivae are  normal. PERRL. EOMI. Head: Atraumatic. Nose: No congestion/rhinnorhea. Mouth/Throat: Mucous membranes are moist.  Oropharynx non-erythematous. Neck: No stridor.  Cardiovascular: Normal rate, regular rhythm. Grossly normal heart sounds.  Good peripheral circulation. Respiratory: Normal respiratory effort.  No retractions. Lungs CTAB. Gastrointestinal: Soft and nontender. No distention. No CVA tenderness. Genitourinary: deferred Musculoskeletal: No lower extremity tenderness nor edema.  No joint effusions. Neurologic:  Normal speech and language. No gross focal neurologic deficits are appreciated. No gait instability. Skin:  Skin is warm, dry and intact. No rash noted. Psychiatric: Mood and affect are normal. Speech and behavior are  normal.  ____________________________________________   LABS (all labs ordered are listed, but only abnormal results are displayed)  Labs Reviewed  BASIC METABOLIC PANEL - Abnormal; Notable for the following:    Potassium 3.4 (*)    Glucose, Bld 103 (*)    All other components within normal limits  CBC  TROPONIN I  TROPONIN I  TROPONIN I   ____________________________________________  EKG  ED ECG REPORT I, Gayla DossGayle, Dashun Borre A, the attending physician, personally viewed and interpreted this ECG.   Date: 06/09/2015  EKG Time: 05:45  Rate: 78  Rhythm: normal sinus rhythm  Axis: normal  Intervals:none, WPW , ventricular pre-excitation  ST&T Change: no acute ST elevation   ____________________________________________  RADIOLOGY  CXR IMPRESSION: No active cardiopulmonary disease. ____________________________________________   PROCEDURES  Procedure(s) performed: None  Critical Care performed: No  ____________________________________________   INITIAL IMPRESSION / ASSESSMENT AND PLAN / ED COURSE  Pertinent labs & imaging results that were available during my care of the patient were reviewed by me and considered in my medical decision making (see chart for details).  Jon Steele is a 34 y.o. male with history of hypertension, tobacco use who presents for evaluation of chest pain and shortness of breath this morning, gradual onset, constant since onset, worse with exertion, currently mild. On exam, he is generally well-appearing and in no acute distress. Vital signs stable, he is afebrile. He has a benign examination. I'm unable to appreciate the murmur that Dr. Juliann Parescallwood  appreciated on his examination in clinic. EKG today shows Wolff-Parkinson-White type B pattern which was not previously appreciated on prior EKGs. Labs reviewed and first troponin is negative however my concern for this patient is for possible ACS given worsening dyspnea on exertion, chest pain.  Additionally, in the setting of new EKG findings, history of murmur, no prior echo or provocative testing, I believe he warrants admission at this time for further inpatient workup. I discussed the case with Dr. Darrold JunkerParaschos whois in agreement with the plan. I discussed the case with the hospitalist, Dr. Loni MuseKate Walsh at 8:15 am she will admit the patient. Aspirin given. ____________________________________________   FINAL CLINICAL IMPRESSION(S) / ED DIAGNOSES  Final diagnoses:  Chest pain, unspecified chest pain type  SOB (shortness of breath) on exertion      Gayla DossEryka A Regenia Erck, MD 06/09/15 [phone]

## 2015-06-10 ENCOUNTER — Observation Stay: Payer: Self-pay

## 2015-06-10 ENCOUNTER — Encounter: Payer: Self-pay | Admitting: Internal Medicine

## 2015-06-10 LAB — NM MYOCAR MULTI W/SPECT W/WALL MOTION / EF
CHL CUP NUCLEAR SSS: 0
LV dias vol: 73 mL
LVSYSVOL: 23 mL
NUC STRESS TID: 0.95
SDS: 0
SRS: 0

## 2015-06-10 MED ORDER — IOHEXOL 350 MG/ML SOLN: 75.0000 mL | Freq: Once | INTRAVENOUS | Status: DC | PRN

## 2015-06-10 MED ORDER — BUSPIRONE HCL 5 MG PO TABS
10.0000 mg | ORAL_TABLET | Freq: Every day | ORAL | Status: DC
  Administered 2015-06-10: 10 mg via ORAL
  Filled 2015-06-10: qty 2

## 2015-06-10 MED ORDER — TECHNETIUM TC 99M SESTAMIBI GENERIC - CARDIOLITE
31.6300 | Freq: Once | INTRAVENOUS | Status: AC | PRN
  Administered 2015-06-10: 31.63 via INTRAVENOUS

## 2015-06-10 MED ORDER — POTASSIUM CHLORIDE CRYS ER 20 MEQ PO TBCR
40.0000 meq | EXTENDED_RELEASE_TABLET | Freq: Once | ORAL | Status: AC
  Administered 2015-06-10: 40 meq via ORAL
  Filled 2015-06-10: qty 2

## 2015-06-10 MED ORDER — TECHNETIUM TC 99M SESTAMIBI GENERIC - CARDIOLITE
13.7000 | Freq: Once | INTRAVENOUS | Status: AC | PRN
  Administered 2015-06-10: 13.7 via INTRAVENOUS

## 2015-06-10 MED ORDER — IOHEXOL 300 MG/ML  SOLN
75.0000 mL | Freq: Once | INTRAMUSCULAR | Status: AC | PRN
  Administered 2015-06-10: 75 mL via INTRAVENOUS

## 2015-06-10 MED ORDER — PANTOPRAZOLE SODIUM 40 MG PO TBEC
40.0000 mg | DELAYED_RELEASE_TABLET | Freq: Two times a day (BID) | ORAL | Status: DC
  Administered 2015-06-10: 40 mg via ORAL
  Filled 2015-06-10: qty 1

## 2015-06-10 NOTE — Progress Notes (Signed)
Initial Nutrition Assessment   INTERVENTION:   Meals and Snacks: Cater to patient preferences Medical Food Supplement Therapy: will recommend on follow if intake poor. RD notes pt was drinking [employer] Breakfast PTA Coordination of Care: will continue to assess for malnutrition using guidelines from AND/ASPEN as pt out of room on visit   NUTRITION DIAGNOSIS:   Inadequate oral intake related to altered GI function as evidenced by per patient/family report.  GOAL:   Patient will meet greater than or equal to 90% of their needs  MONITOR:    (Energy Intake, Anthropometrics, digestive system, Electrolyte and renal Profile)  REASON FOR ASSESSMENT:   Malnutrition Screening Tool    ASSESSMENT:   Pt admitted with CP. Pt out of room on visit for CT, father present in room.  Past Medical History  Diagnosis Date  . Tobacco abuse   . Dysphagia   . Hypertension   . Anxiety   . WPW (Wolff-Parkinson-White syndrome)      Diet Order:  Diet regular Room service appropriate?: Yes; Fluid consistency:: Thin Diet general    Current Nutrition: Father reports pt ate 100% of sandwich today without difficulty. Pt father reports medications have eased chest pain and pt has been able to eat regular foods since admission without difficulty.   Food/Nutrition-Related History: Pt father reports pt has been eating eggs, pears, and Carnation Instant Breakfast (up to 6 per day) only for the past 3 weeks PTA secondary to dysphagia and chest tightness. RD notes each Carn[employer] Instant Breakfast provides approximately 300kcals and 13g protein.   Scheduled Medications:  . aspirin EC  81 mg Oral Daily  . busPIRone  10 mg Oral Daily  . docusate sodium  100 mg Oral BID  . enoxaparin (LOVENOX) injection  40 mg Subcutaneous Q24H  . pantoprazole  40 mg Oral BID AC  . sodium chloride  3 mL Intravenous Q12H  . sodium chloride  3 mL Intravenous Q12H     Electrolyte/Renal Profile and Glucose  Profile:   Recent Labs Lab 06/09/15 0601  NA 138  K 3.4*  CL 101  CO2 30  BUN 16  CREATININE 0.96  CALCIUM 10.0  GLUCOSE 103*   Protein Profile: No results for input(s): ALBUMIN in the last 168 hours.  Gastrointestinal Profile: Last BM:  06/08/2015   Nutrition-Focused Physical Exam Findings:  Unable to complete Nutrition-Focused physical exam at this time.    Weight Change: Per father pt has lost 10-12lbs in the past 6 weeks (7% in 6 weeks)   Height:   Ht Readings from Last 1 Encounters:  06/09/15 5\' 10"  (1.778 m)    Weight:   Wt Readings from Last 1 Encounters:  06/09/15 155 lb 8 oz (70.534 kg)    BMI:  Body mass index is 22.31 kg/(m^2).   Estimated Nutritional Needs:   Kcal:  BEE: 1646kcals, TEE: (IF 1.1-1.3)(AF 1.3) 2354-2782kcals  Protein:  56-78g protein (0.8-1.0g/kg)  Fluid:  1762-211615mL of fluid (25-6830ml/kg)   EDUCATION NEEDS:   Education needs no appropriate at this time  HIGH Care Level  Leda QuailAllyson Ly Bacchi, RD, LDN Pager [phone]

## 2015-06-10 NOTE — Care Management Note (Signed)
Case Management Note  Patient Details  Name: Jon Steele MRN: [id number] Date of Birth: 07/08/1981  Subjective/Objective:                 Patient presents from home with chest pain.  Patient lives at home with Dad.  Patient currently does not have a source of income and has not health insurance.  Patient states that he uses [facility] on [address] to obtain his medication.  He has been able to afford his medications out of pocket thus far.  Patient states that he has a source of transportation.  Patient states that he currently has a PCP and would like to stay in their practice and pay out of pocket.  I provided patient with application to [facility].  Patient states that he as contacted [facility] assistance [facility].    Action/Plan: Monitor for medication needs a time of discharge.    Expected Discharge Date:                  Expected Discharge Plan:     In-House Referral:     Discharge planning Services     Post Acute Care Choice:    Choice offered to:     DME Arranged:    DME Agency:     HH Arranged:    HH Agency:     Status of Service:     Medicare Important Message Given:    Date Medicare IM Given:    Medicare IM give by:    Date Additional Medicare IM Given:    Additional Medicare Important Message give by:     If discussed at Long Length of Stay Meetings, dates discussed:    Additional Comments:  Chapman FitchBOWEN, Tiwana Chavis T, RN 06/10/2015, 12:03 PM

## 2015-06-10 NOTE — Discharge Instructions (Addendum)
°  DIET:  Regular diet  DISCHARGE CONDITION:  Stable  ACTIVITY:  Activity as tolerated  OXYGEN:  Home Oxygen: No.   Oxygen Delivery: room air  DISCHARGE LOCATION:  home   If you experience worsening of your admission symptoms, develop shortness of breath, life threatening emergency, suicidal or homicidal thoughts you must seek medical attention immediately by calling 911 or calling your MD immediately  if symptoms less severe.  You Must read complete instructions/literature along with all the possible adverse reactions/side effects for all the Medicines you take and that have been prescribed to you. Take any new Medicines after you have completely understood and accpet all the possible adverse reactions/side effects.   Please note  You were cared for by a hospitalist during your hospital stay. If you have any questions about your discharge medications or the care you received while you were in the hospital after you are discharged, you can call the unit and asked to speak with the hospitalist on call if the hospitalist that took care of you is not available. Once you are discharged, your primary care physician will handle any further medical issues. Please note that NO REFILLS for any discharge medications will be authorized once you are discharged, as it is imperative that you return to your primary care physician (or establish a relationship with a primary care physician if you do not have one) for your aftercare needs so that they can reassess your need for medications and monitor your lab values.   You will need a Pulmonary function study ordered by your family doctor.

## 2015-06-13 NOTE — Discharge Summary (Signed)
[facility] [place] [facility]   PATIENT NAME: Jon BellowsMatthew Steele    MR#:  [id number]  DATE OF BIRTH:  02/28/1981  DATE OF ADMISSION:  06/09/2015 ADMITTING PHYSICIAN: Milagros LollSrikar Farris Geiman, MD  DATE OF DISCHARGE: 06/10/2015  6:31 PM  PRIMARY CARE PHYSICIAN: Pricilla HolmSHARPE, LESLIE M, MD    ADMISSION DIAGNOSIS:  SOB (shortness of breath) on exertion [R06.02] Chest pain, unspecified chest pain type [R07.9]  DISCHARGE DIAGNOSIS:  Active Problems:   Chest pain   SECONDARY DIAGNOSIS:   Past Medical History  Diagnosis Date  . Tobacco abuse   . Dysphagia   . Hypertension   . Anxiety   . WPW (Wolff-Parkinson-White syndrome)      ADMITTING HISTORY  Jon Steele is a 34 y.o. male with a known history of retention, GERD presents to the emergency room complaining of left chest pain radiating to left arm. Symptoms started 6 weeks prior along with shortness of breath. Extreme fatigue. Symptoms have been slowly worsening. No nausea, lightheadedness, syncope, palpitations. Patient was seen by Dr. Juliann Paresallwood of cardiology and scheduled for an outpatient stress test. Patient was also seen by GI and neurology and had endoscopy which was normal. Biopsy results pending. Due to worsening symptoms patient is being admitted under observation to rule out acute coronary syndrome. Kartik enzymes normal.   HOSPITAL COURSE:   Patient admitted to tele floor due to CP. Atypical symptoms. seen by cardiology Dr. Adrienne MochaParaschoes. Cardiac enzymes were normal. Telemetry showed no arrhythmias. Patient had nuclear stress test which is normal. Echocardiogram showed no valvular lesions. No wall motion abnormalities. A d-dimer was checked which was normal. But due to ongoing pleuritic chest pain patient had a CTA of the chest which showed no pulmonary embolism. Nothing else acute found. At this point due to patient's ongoing symptoms of shortness of breath exertion I have recommended that he get an  outpatient pulmonary function test with his primary care physician. Patient was extremely frustrated as a final diagnosis could not be found during his admission. All questions answered to patient's satisfaction and requested to follow-up with his primary care physician. Vitals stable at discharge and patient ambulating well.   CONSULTS OBTAINED:  Treatment Team:  Marcina MillardAlexander Paraschos, MD  DRUG ALLERGIES:  No Known Allergies  DISCHARGE MEDICATIONS:   Discharge Medication List as of 06/10/2015  5:59 PM    CONTINUE these medications which have NOT CHANGED   Details  atenolol (TENORMIN) 25 MG tablet Take 25 mg by mouth every evening., Until Discontinued, Historical Med    busPIRone (BUSPAR) 10 MG tablet Take 10 mg by mouth daily., Until Discontinued, Historical Med    pantoprazole (PROTONIX) 40 MG tablet Take 40 mg by mouth 2 (two) times daily before a meal., Until Discontinued, Historical Med         Today    VITAL SIGNS:  Blood pressure 98/63, pulse 67, temperature 97.5 F (36.4 C), temperature source Oral, resp. rate 19, height 5\' 10"  (1.778 m), weight 70.534 kg (155 lb 8 oz), SpO2 99 %.  I/O:  No intake or output data in the 24 hours ending 06/13/15 2314  PHYSICAL EXAMINATION:  Physical Exam  GENERAL:  34 y.o.-year-old patient lying in the bed with no acute distress.  LUNGS: Normal breath sounds bilaterally, no wheezing, rales,rhonchi or crepitation. No use of accessory muscles of respiration.  CARDIOVASCULAR: S1, S2 normal. No murmurs, rubs, or gallops.  ABDOMEN: Soft, non-tender, non-distended. Bowel sounds present. No organomegaly or mass.  NEUROLOGIC: Moves all  4 extremities. PSYCHIATRIC: The patient is alert and oriented x 3.  SKIN: No obvious rash, lesion, or ulcer.   DATA REVIEW:   CBC  Recent Labs Lab 06/09/15 0601  WBC 6.2  HGB 15.9  HCT 46.6  PLT 169    Chemistries   Recent Labs Lab 06/09/15 0601  NA 138  K 3.4*  CL 101  CO2 30  GLUCOSE  103*  BUN 16  CREATININE 0.96  CALCIUM 10.0    Cardiac Enzymes  Recent Labs Lab 06/09/15 2029  TROPONINI <0.03    Microbiology Results  No results found for this or any previous visit.  RADIOLOGY:  No results found.    Follow up with PCP in 1 week.  Management plans discussed with the patient, family and they are in agreement.  CODE STATUS:   TOTAL TIME TAKING CARE OF THIS PATIENT ON DAY OF DISCHARGE: more than 30 minutes.    Milagros Loll R M.D on 06/13/2015 at 11:14 PM  Between 7am to 6pm - Pager - [phone]  After 6pm go to [email or web address] - password [email or web address] [facility]  [facility]  Office  [phone]  CC: Primary care physician; Pricilla Holm, MD     Note: This dictation was prepared with Dragon dictation along with smaller phrase technology. Any transcriptional errors that result from this process are unintentional.

## 2015-06-23 ENCOUNTER — Emergency Department: Payer: Self-pay

## 2015-06-23 ENCOUNTER — Emergency Department
Admission: EM | Admit: 2015-06-23 | Discharge: 2015-06-23 | Disposition: A | Discharge status: Home / Self Care | Payer: Self-pay | Attending: Emergency Medicine | Admitting: Emergency Medicine

## 2015-06-23 DIAGNOSIS — Z79899 Other long term (current) drug therapy: Secondary | ICD-10-CM | POA: Insufficient documentation

## 2015-06-23 DIAGNOSIS — M79602 Pain in left arm: Secondary | ICD-10-CM | POA: Insufficient documentation

## 2015-06-23 DIAGNOSIS — F17210 Nicotine dependence, cigarettes, uncomplicated: Secondary | ICD-10-CM | POA: Insufficient documentation

## 2015-06-23 DIAGNOSIS — R0602 Shortness of breath: Secondary | ICD-10-CM | POA: Insufficient documentation

## 2015-06-23 DIAGNOSIS — R0789 Other chest pain: Secondary | ICD-10-CM

## 2015-06-23 DIAGNOSIS — I10 Essential (primary) hypertension: Secondary | ICD-10-CM | POA: Insufficient documentation

## 2015-06-23 DIAGNOSIS — R42 Dizziness and giddiness: Secondary | ICD-10-CM | POA: Insufficient documentation

## 2015-06-23 DIAGNOSIS — R079 Chest pain, unspecified: Secondary | ICD-10-CM | POA: Insufficient documentation

## 2015-06-23 LAB — BASIC METABOLIC PANEL
Anion gap: 5 (ref 5–15)
BUN: 15 mg/dL (ref 6–20)
CHLORIDE: 105 mmol/L (ref 101–111)
CO2: 29 mmol/L (ref 22–32)
Calcium: 9.4 mg/dL (ref 8.9–10.3)
Creatinine, Ser: 0.97 mg/dL (ref 0.61–1.24)
GFR calc non Af Amer: >60 mL/min (ref >60)
Glucose, Bld: 90 mg/dL (ref 65–99)
POTASSIUM: 3.8 mmol/L (ref 3.5–5.1)
SODIUM: 139 mmol/L (ref 135–145)

## 2015-06-23 LAB — CBC
HEMATOCRIT: 43 % (ref 40.0–52.0)
HEMOGLOBIN: 14.6 g/dL (ref 13.0–18.0)
MCH: 31.8 pg (ref 26.0–34.0)
MCHC: 34 g/dL (ref 32.0–36.0)
MCV: 93.7 fL (ref 80.0–100.0)
Platelets: 196 10*3/uL (ref 150–440)
RBC: 4.59 MIL/uL (ref 4.40–5.90)
RDW: 12.7 % (ref 11.5–14.5)
WBC: 6.5 10*3/uL (ref 3.8–10.6)

## 2015-06-23 LAB — TROPONIN I: Troponin I: <0.03 ng/mL (ref <0.031)

## 2015-06-23 MED ORDER — SUCRALFATE 1 G PO TABS: 1.0000 g | ORAL_TABLET | Freq: Four times a day (QID) | ORAL | Status: DC

## 2015-06-23 MED ORDER — GI COCKTAIL ~~LOC~~
30.0000 mL | Freq: Once | ORAL | Status: AC
  Administered 2015-06-23: 30 mL via ORAL
  Filled 2015-06-23: qty 30

## 2015-06-23 NOTE — Discharge Instructions (Signed)
Nonspecific Chest Pain  °Chest pain can be caused by many different conditions. There is always a chance that your pain could be related to something serious, such as a heart attack or a blood clot in your lungs. Chest pain can also be caused by conditions that are not life-threatening. If you have chest pain, it is very important to follow up with your health care provider. °CAUSES  °Chest pain can be caused by: °· Heartburn. °· Pneumonia or bronchitis. °· Anxiety or stress. °· Inflammation around your heart (pericarditis) or lung (pleuritis or pleurisy). °· A blood clot in your lung. °· A collapsed lung (pneumothorax). It can develop suddenly on its own (spontaneous pneumothorax) or from trauma to the chest. °· Shingles infection (varicella-zoster virus). °· Heart attack. °· Damage to the bones, muscles, and cartilage that make up your chest wall. This can include: °¨ Bruised bones due to injury. °¨ Strained muscles or cartilage due to frequent or repeated coughing or overwork. °¨ Fracture to one or more ribs. °¨ Sore cartilage due to inflammation (costochondritis). °RISK FACTORS  °Risk factors for chest pain may include: °· Activities that increase your risk for trauma or injury to your chest. °· Respiratory infections or conditions that cause frequent coughing. °· Medical conditions or overeating that can cause heartburn. °· Heart disease or family history of heart disease. °· Conditions or health behaviors that increase your risk of developing a blood clot. °· Having had chicken pox (varicella zoster). °SIGNS AND SYMPTOMS °Chest pain can feel like: °· Burning or tingling on the surface of your chest or deep in your chest. °· Crushing, pressure, aching, or squeezing pain. °· Dull or sharp pain that is worse when you move, cough, or take a deep breath. °· Pain that is also felt in your back, neck, shoulder, or arm, or pain that spreads to any of these areas. °Your chest pain may come and go, or it may stay  constant. °DIAGNOSIS °Lab tests or other studies may be needed to find the cause of your pain. Your health care provider may have you take a test called an ambulatory ECG (electrocardiogram). An ECG records your heartbeat patterns at the time the test is performed. You may also have other tests, such as: °· Transthoracic echocardiogram (TTE). During echocardiography, sound waves are used to create a picture of all of the heart structures and to look at how blood flows through your heart. °· Transesophageal echocardiogram (TEE). This is a more advanced imaging test that obtains images from inside your body. It allows your health care provider to see your heart in finer detail. °· Cardiac monitoring. This allows your health care provider to monitor your heart rate and rhythm in real time. °· Holter monitor. This is a portable device that records your heartbeat and can help to diagnose abnormal heartbeats. It allows your health care provider to track your heart activity for several days, if needed. °· Stress tests. These can be done through exercise or by taking medicine that makes your heart beat more quickly. °· Blood tests. °· Imaging tests. °TREATMENT  °Your treatment depends on what is causing your chest pain. Treatment may include: °· Medicines. These may include: °¨ Acid blockers for heartburn. °¨ Anti-inflammatory medicine. °¨ Pain medicine for inflammatory conditions. °¨ Antibiotic medicine, if an infection is present. °¨ Medicines to dissolve blood clots. °¨ Medicines to treat coronary artery disease. °· Supportive care for conditions that do not require medicines. This may include: °¨ Resting. °¨ Applying heat   or cold packs to injured areas. °¨ Limiting activities until pain decreases. °HOME CARE INSTRUCTIONS °· If you were prescribed an antibiotic medicine, finish it all even if you start to feel better. °· Avoid any activities that bring on chest pain. °· Do not use any tobacco products, including  cigarettes, chewing tobacco, or electronic cigarettes. If you need help quitting, ask your health care provider. °· Do not drink alcohol. °· Take medicines only as directed by your health care provider. °· Keep all follow-up visits as directed by your health care provider. This is important. This includes any further testing if your chest pain does not go away. °· If heartburn is the cause for your chest pain, you may be told to keep your head raised (elevated) while sleeping. This reduces the chance that acid will go from your stomach into your esophagus. °· Make lifestyle changes as directed by your health care provider. These may include: °¨ Getting regular exercise. Ask your health care provider to suggest some activities that are safe for you. °¨ Eating a heart-healthy diet. A registered dietitian can help you to learn healthy eating options. °¨ Maintaining a healthy weight. °¨ Managing diabetes, if necessary. °¨ Reducing stress. °SEEK MEDICAL CARE IF: °· Your chest pain does not go away after treatment. °· You have a rash with blisters on your chest. °· You have a fever. °SEEK IMMEDIATE MEDICAL CARE IF:  °· Your chest pain is worse. °· You have an increasing cough, or you cough up blood. °· You have severe abdominal pain. °· You have severe weakness. °· You faint. °· You have chills. °· You have sudden, unexplained chest discomfort. °· You have sudden, unexplained discomfort in your arms, back, neck, or jaw. °· You have shortness of breath at any time. °· You suddenly start to sweat, or your skin gets clammy. °· You feel nauseous or you vomit. °· You suddenly feel light-headed or dizzy. °· Your heart begins to beat quickly, or it feels like it is skipping beats. °These symptoms may represent a serious problem that is an emergency. Do not wait to see if the symptoms will go away. Get medical help right away. Call your local emergency services (911 in the [place] Do not drive yourself to the hospital. °  °This  information is not intended to replace advice given to you by your health care provider. Make sure you discuss any questions you have with your health care provider. °  °Document Released: 04/25/2005 Document Revised: 08/06/2014 Document Reviewed: 02/19/2014 °Elsevier Interactive Patient Education ©2016 [employer] ° °Please return immediately if condition worsens. Please contact her primary physician or the physician you were given for referral. If you have any specialist physicians involved in her treatment and plan please also contact them. Thank you for using [facility]. ° °

## 2015-06-23 NOTE — ED Provider Notes (Signed)
Time Seen: Approximately 1717  I have reviewed the triage notes  Chief Complaint: Chest Pain   History of Present Illness: Jon Steele is a 34 y.o. male who presents with repeat episodes of left-sided chest discomfort with a deep fluttering sensation with some shortness of breath and lightheadedness. And states he feels occasional pressure in his left arm. He's had extensive evaluations from a gastrointestinal and also a cardiovascular standpoint. Patient's had a normal echocardiogram. He had an upper GI evaluation with an upcoming swallowing study. He does have a history of gastroesophageal reflux disease. He denies any fever, chills or productive cough. States his discomforts been intermittent over the last 3-4 days.   Past Medical History  Diagnosis Date  . Tobacco abuse   . Dysphagia   . Hypertension   . Anxiety   . WPW (Wolff-Parkinson-White syndrome)     Patient Active Problem List   Diagnosis Date Noted  . Chest pain 06/09/2015    Past Surgical History  Procedure Laterality Date  . Strabismus surgery      Past Surgical History  Procedure Laterality Date  . Strabismus surgery      Current Outpatient Rx  Name  Route  Sig  Dispense  Refill  . atenolol (TENORMIN) 25 MG tablet   Oral   Take 25 mg by mouth every evening.         . busPIRone (BUSPAR) 10 MG tablet   Oral   Take 10 mg by mouth daily.         . pantoprazole (PROTONIX) 40 MG tablet   Oral   Take 40 mg by mouth 2 (two) times daily before a meal.         . sucralfate (CARAFATE) 1 G tablet   Oral   Take 1 tablet (1 g total) by mouth 4 (four) times daily.   28 tablet   1     Allergies:  Review of patient's allergies indicates no known allergies.  Family History: Family History  Problem Relation Age of Onset  . Clotting disorder Father     Social History: Social History  Substance Use Topics  . Smoking status: Current Every Day Smoker -- 1.00 packs/day    Types: Cigarettes  .  Smokeless tobacco: Never Used  . Alcohol Use: None     Comment: daily, beers     Review of Systems:   10 point review of systems was performed and was otherwise negative:  Constitutional: No fever Eyes: No visual disturbances ENT: No sore throat, ear pain Cardiac: Vague left-sided chest discomfort not necessarily pruritic pleuritic or reproducible with movement. No orthopnea. This occasionally the discomfort worse at night and first thing in the morning. Respiratory: No shortness of breath, wheezing, or stridor Abdomen: No abdominal pain, no vomiting, No diarrhea Endocrine: No weight loss, No night sweats Extremities: No peripheral edema, cyanosis Skin: No rashes, easy bruising Neurologic: No focal weakness, trouble with speech or swollowing Urologic: No dysuria, Hematuria, or urinary frequency   Physical Exam:  ED Triage Vitals  Enc Vitals Group     BP 06/23/15 1636 121/70 mmHg     Pulse Rate 06/23/15 1634 68     Resp 06/23/15 1634 18     Temp 06/23/15 1634 98.5 F (36.9 C)     Temp Source 06/23/15 1634 Oral     SpO2 06/23/15 1634 99 %     Weight 06/23/15 1634 155 lb (70.308 kg)     Height 06/23/15 1634  (  1.778 m)     Head Cir --      Peak Flow --      Pain Score 06/23/15 1634 5     Pain Loc --      Pain Edu? --      Excl. in GC? --     General: Awake , Alert , and Oriented times 3; GCS 15 Head: Normal cephalic , atraumatic Eyes: Pupils equal , round, reactive to light Nose/Throat: No nasal drainage, patent upper airway without erythema or exudate.  Neck: Supple, Full range of motion, No anterior adenopathy or palpable thyroid masses Lungs: Clear to ascultation without wheezes , rhonchi, or rales Heart: Regular rate, regular rhythm without murmurs , gallops , or rubs Abdomen: Soft, non tender without rebound, guarding , or rigidity; bowel sounds positive and symmetric in all 4 quadrants. No organomegaly .        Extremities: 2 plus symmetric pulses. No  edema, clubbing or cyanosis Neurologic: normal ambulation, Motor symmetric without deficits, sensory intact Skin: warm, dry, no rashes   Labs:   All laboratory work was reviewed including any pertinent negatives or positives listed below:  Labs Reviewed  BASIC METABOLIC PANEL  CBC  TROPONIN I   review of laboratory work was within normal limits  EKG:  ED ECG REPORT I, Jennye MoccasinBrian S Lizvet Chunn, the attending physician, personally viewed and interpreted this ECG.  Date: 06/23/2015 EKG Time: 1635 Rate: 73 Rhythm: normal sinus rhythm QRS Axis: normal Intervals: No PR interval ST/T Wave abnormalities: normal Conduction Disutrbances: none Narrative Interpretation: unremarkable No significant change  Radiology: * Final result by Rad Results In Interface (06/23/15 17:04:02)   Narrative:   CLINICAL DATA: Chest pain with shortness of breath  EXAM: CHEST 2 VIEW  COMPARISON: Chest radiograph June 09, 2015 and chest CT June 10, 2015  FINDINGS: Lungs are clear. Heart size and pulmonary vascularity are normal. No adenopathy. No pneumothorax. No bone lesions. There is spina bifida occulta at C7.  IMPRESSION: No edema or consolidation.         I personally reviewed the radiologic studies     ED Course: * Patient's also had a recent chest CT done earlier this month which did not show any abnormalities per radiology read. No evidence of pulmonary embolism and I would suspect his discomfort being slightly more persistent. I advised him I was not sure of the nature of his discomfort but did not appear to be a life-threatening etiology at this time and continue with outpatient management. Given a GI cocktail with really no symptomatic improvement. I would suspect if it was cardiovascular since it's been going on for last 3-4 days and something abnormal would show up in either his laboratory work, his EKG, or echocardiogram that he had previously.   Assessment Acute  exacerbation of atypical chest pain   Final Clinical Impression:  * Final diagnoses:  Chest pain of uncertain etiology     Plan: Outpatient management Patient was advised to return immediately if condition worsens. Patient was advised to follow up with her primary care physician or other specialized physicians involved and in their current assessment. Patient was advised take Tylenol over-the-counter for pain and also was given prescription for Carafate and copies of all his laboratory work here in emergency department. She has not upcoming appointment with a new primary physician and his gathering all his data for outpatient evaluation.            Jennye MoccasinBrian S Leina Babe, MD 06/23/15 [phone]

## 2015-06-23 NOTE — ED Notes (Signed)
MD at bedside. 

## 2015-06-23 NOTE — ED Notes (Signed)
EKG at 1633, not 1433

## 2015-06-23 NOTE — ED Notes (Signed)
Pt states that he felt CP approx 1 hour ago and has continued. States CP began at rest. Admitted X 2 weeks ago for similar symptoms. Pt has followup on Monday with cardiology. Pt also reports back pain, shoulder pain, SOB, weakness with the CP. Pt ambulatory. Color WNL.

## 2015-06-26 ENCOUNTER — Emergency Department: Payer: Self-pay

## 2015-06-26 ENCOUNTER — Emergency Department
Admission: EM | Admit: 2015-06-26 | Discharge: 2015-06-26 | Disposition: A | Discharge status: Home / Self Care | Payer: Self-pay | Attending: Emergency Medicine | Admitting: Emergency Medicine

## 2015-06-26 ENCOUNTER — Encounter: Payer: Self-pay | Admitting: Emergency Medicine

## 2015-06-26 DIAGNOSIS — F17210 Nicotine dependence, cigarettes, uncomplicated: Secondary | ICD-10-CM | POA: Insufficient documentation

## 2015-06-26 DIAGNOSIS — R109 Unspecified abdominal pain: Secondary | ICD-10-CM

## 2015-06-26 DIAGNOSIS — I10 Essential (primary) hypertension: Secondary | ICD-10-CM | POA: Insufficient documentation

## 2015-06-26 DIAGNOSIS — Z79899 Other long term (current) drug therapy: Secondary | ICD-10-CM | POA: Insufficient documentation

## 2015-06-26 DIAGNOSIS — N201 Calculus of ureter: Secondary | ICD-10-CM | POA: Insufficient documentation

## 2015-06-26 LAB — COMPREHENSIVE METABOLIC PANEL
ALBUMIN: 4.5 g/dL (ref 3.5–5.0)
ALT: 38 U/L (ref 17–63)
AST: 25 U/L (ref 15–41)
Alkaline Phosphatase: 77 U/L (ref 38–126)
Anion gap: 6 (ref 5–15)
BUN: 14 mg/dL (ref 6–20)
CHLORIDE: 104 mmol/L (ref 101–111)
CO2: 29 mmol/L (ref 22–32)
Calcium: 9.6 mg/dL (ref 8.9–10.3)
Creatinine, Ser: 1.02 mg/dL (ref 0.61–1.24)
GFR calc Af Amer: >60 mL/min (ref >60)
GFR calc non Af Amer: >60 mL/min (ref >60)
GLUCOSE: 112 mg/dL — AB (ref 65–99)
POTASSIUM: 3.3 mmol/L — AB (ref 3.5–5.1)
SODIUM: 139 mmol/L (ref 135–145)
Total Bilirubin: 0.8 mg/dL (ref 0.3–1.2)
Total Protein: 6.8 g/dL (ref 6.5–8.1)

## 2015-06-26 LAB — URINALYSIS COMPLETE WITH MICROSCOPIC (ARMC ONLY)
BACTERIA UA: NONE SEEN
Bilirubin Urine: NEGATIVE
Glucose, UA: NEGATIVE mg/dL
Ketones, ur: NEGATIVE mg/dL
LEUKOCYTES UA: NEGATIVE
Nitrite: NEGATIVE
PH: 5 (ref 5.0–8.0)
PROTEIN: 100 mg/dL — AB
SQUAMOUS EPITHELIAL / LPF: NONE SEEN
Specific Gravity, Urine: 1.024 (ref 1.005–1.030)

## 2015-06-26 LAB — CBC
HCT: 43 % (ref 40.0–52.0)
Hemoglobin: 14.9 g/dL (ref 13.0–18.0)
MCH: 32.5 pg (ref 26.0–34.0)
MCHC: 34.7 g/dL (ref 32.0–36.0)
MCV: 93.8 fL (ref 80.0–100.0)
PLATELETS: 172 10*3/uL (ref 150–440)
RBC: 4.58 MIL/uL (ref 4.40–5.90)
RDW: 12.8 % (ref 11.5–14.5)
WBC: 6.5 10*3/uL (ref 3.8–10.6)

## 2015-06-26 MED ORDER — NAPROXEN 500 MG PO TABS: 500.0000 mg | ORAL_TABLET | Freq: Two times a day (BID) | ORAL | Status: DC

## 2015-06-26 MED ORDER — TAMSULOSIN HCL 0.4 MG PO CAPS: 0.4000 mg | ORAL_CAPSULE | Freq: Every day | ORAL | Status: DC

## 2015-06-26 MED ORDER — KETOROLAC TROMETHAMINE 30 MG/ML IJ SOLN
30.0000 mg | Freq: Once | INTRAMUSCULAR | Status: AC
  Administered 2015-06-26: 30 mg via INTRAVENOUS
  Filled 2015-06-26: qty 1

## 2015-06-26 MED ORDER — ONDANSETRON HCL 4 MG/2ML IJ SOLN
4.0000 mg | Freq: Once | INTRAMUSCULAR | Status: AC
  Administered 2015-06-26: 4 mg via INTRAVENOUS
  Filled 2015-06-26: qty 2

## 2015-06-26 MED ORDER — SODIUM CHLORIDE 0.9 % IV SOLN
1000.0000 mL | Freq: Once | INTRAVENOUS | Status: AC
  Administered 2015-06-26: 1000 mL via INTRAVENOUS

## 2015-06-26 MED ORDER — ONDANSETRON HCL 4 MG PO TABS: 4.0000 mg | ORAL_TABLET | Freq: Every day | ORAL | Status: DC | PRN

## 2015-06-26 MED ORDER — OXYCODONE-ACETAMINOPHEN 5-325 MG PO TABS: 1.0000 | ORAL_TABLET | Freq: Four times a day (QID) | ORAL | Status: DC | PRN

## 2015-06-26 NOTE — Discharge Instructions (Signed)
Kidney Stones °Kidney stones (urolithiasis) are deposits that form inside your kidneys. The intense pain is caused by the stone moving through the urinary tract. When the stone moves, the ureter goes into spasm around the stone. The stone is usually passed in the urine.  °CAUSES  °· A disorder that makes certain neck glands produce too much parathyroid hormone (primary hyperparathyroidism). °· A buildup of uric acid crystals, similar to gout in your joints. °· Narrowing (stricture) of the ureter. °· A kidney obstruction present at birth (congenital obstruction). °· Previous surgery on the kidney or ureters. °· Numerous kidney infections. °SYMPTOMS  °· Feeling sick to your stomach (nauseous). °· Throwing up (vomiting). °· Blood in the urine (hematuria). °· Pain that usually spreads (radiates) to the groin. °· Frequency or urgency of urination. °DIAGNOSIS  °· Taking a history and physical exam. °· Blood or urine tests. °· CT scan. °· Occasionally, an examination of the inside of the urinary bladder (cystoscopy) is performed. °TREATMENT  °· Observation. °· Increasing your fluid intake. °· Extracorporeal shock wave lithotripsy--This is a noninvasive procedure that uses shock waves to break up kidney stones. °· Surgery may be needed if you have severe pain or persistent obstruction. There are various surgical procedures. Most of the procedures are performed with the use of small instruments. Only small incisions are needed to accommodate these instruments, so recovery time is minimized. °The size, location, and chemical composition are all important variables that will determine the proper choice of action for you. Talk to your health care provider to better understand your situation so that you will minimize the risk of injury to yourself and your kidney.  °HOME CARE INSTRUCTIONS  °· Drink enough water and fluids to keep your urine clear or pale yellow. This will help you to pass the stone or stone fragments. °· Strain  all urine through the provided strainer. Keep all particulate matter and stones for your health care provider to see. The stone causing the pain may be as small as a grain of salt. It is very important to use the strainer each and every time you pass your urine. The collection of your stone will allow your health care provider to analyze it and verify that a stone has actually passed. The stone analysis will often identify what you can do to reduce the incidence of recurrences. °· Only take over-the-counter or prescription medicines for pain, discomfort, or fever as directed by your health care provider. °· Keep all follow-up visits as told by your health care provider. This is important. °· Get follow-up X-rays if required. The absence of pain does not always mean that the stone has passed. It may have only stopped moving. If the urine remains completely obstructed, it can cause loss of kidney function or even complete destruction of the kidney. It is your responsibility to make sure X-rays and follow-ups are completed. Ultrasounds of the kidney can show blockages and the status of the kidney. Ultrasounds are not associated with any radiation and can be performed easily in a matter of minutes. °· Make changes to your daily diet as told by your health care provider. You may be told to: °¨ Limit the amount of salt that you eat. °¨ Eat 5 or more servings of fruits and vegetables each day. °¨ Limit the amount of meat, poultry, fish, and eggs that you eat. °· Collect a 24-hour urine sample as told by your health care provider. You may need to collect another urine sample every 6-12   months. °SEEK MEDICAL CARE IF: °· You experience pain that is progressive and unresponsive to any pain medicine you have been prescribed. °SEEK IMMEDIATE MEDICAL CARE IF:  °· Pain cannot be controlled with the prescribed medicine. °· You have a fever or shaking chills. °· The severity or intensity of pain increases over 18 hours and is not  relieved by pain medicine. °· You develop a new onset of abdominal pain. °· You feel faint or pass out. °· You are unable to urinate. °  °This information is not intended to replace advice given to you by your health care provider. Make sure you discuss any questions you have with your health care provider. °  °Document Released: 07/16/2005 Document Revised: 04/06/2015 Document Reviewed: 12/17/2012 °Elsevier Interactive Patient Education ©2016 [employer] ° °

## 2015-06-26 NOTE — ED Provider Notes (Signed)
[facility] Emergency Department Provider Note  ____________________________________________  Time seen: On arrival  I have reviewed the triage vital signs and the nursing notes.   HISTORY  Chief Complaint Abdominal Pain    HPI Jon Steele is a 34 y.o. male who presents with relatively abrupt onset of right lower quadrant pain that started around 5 AM. He denies fevers chills. Yesterday he was feeling fine. He has no history of kidney stones. He has not seen blood in his urine. No history of abdominal surgery. He does complain of some nausea but denies vomiting and diarrhea. Did have some discomfort in his right mid back as well earlier yesterday and feels like his urine is been darker than normal.     Past Medical History  Diagnosis Date  . Tobacco abuse   . Dysphagia   . Hypertension   . Anxiety   . WPW (Wolff-Parkinson-White syndrome)     Patient Active Problem List   Diagnosis Date Noted  . Chest pain 06/09/2015    Past Surgical History  Procedure Laterality Date  . Strabismus surgery      Current Outpatient Rx  Name  Route  Sig  Dispense  Refill  . atenolol (TENORMIN) 25 MG tablet   Oral   Take 25 mg by mouth every evening.         . busPIRone (BUSPAR) 10 MG tablet   Oral   Take 10 mg by mouth daily.         . pantoprazole (PROTONIX) 40 MG tablet   Oral   Take 40 mg by mouth 2 (two) times daily before a meal.         . sucralfate (CARAFATE) 1 G tablet   Oral   Take 1 tablet (1 g total) by mouth 4 (four) times daily.   28 tablet   1     Allergies Review of patient's allergies indicates no known allergies.  Family History  Problem Relation Age of Onset  . Clotting disorder Father     Social History Social History  Substance Use Topics  . Smoking status: Current Every Day Smoker -- 1.00 packs/day    Types: Cigarettes  . Smokeless tobacco: Never Used  . Alcohol Use: None     Comment: daily, beers     Review of Systems  Constitutional: Negative for fever. Eyes: Negative for visual changes. ENT: Negative for sore throat Cardiovascular: Negative for chest pain. Respiratory: Negative for shortness of breath. Gastrointestinal: Positive for right flank pain and right abdominal pain Genitourinary: Negative for dysuria. Positive for dark urine Musculoskeletal: Negative for back pain. Skin: Negative for rash. Neurological: Negative for headaches or focal weakness Psychiatric: No anxiety    ____________________________________________   PHYSICAL EXAM:  VITAL SIGNS: ED Triage Vitals  Enc Vitals Group     BP 06/26/15 0732 113/79 mmHg     Pulse Rate 06/26/15 0732 66     Resp 06/26/15 0732 18     Temp 06/26/15 0732 97.6 F (36.4 C)     Temp Source 06/26/15 0732 Oral     SpO2 06/26/15 0732 99 %     Weight 06/26/15 0732 155 lb (70.308 kg)     Height 06/26/15 0732  (1.778 m)     Head Cir --      Peak Flow --      Pain Score --      Pain Loc --      Pain Edu? --  Excl. in GC? --      Constitutional: Alert and oriented. Well appearing. Uncomfortable Eyes: Conjunctivae are normal.  ENT   Head: Normocephalic and atraumatic.   Mouth/Throat: Mucous membranes are moist. Cardiovascular: Normal rate, regular rhythm. Normal and symmetric distal pulses are present in all extremities. No murmurs, rubs, or gallops. Respiratory: Normal respiratory effort without tachypnea nor retractions. Breath sounds are clear and equal bilaterally.  Gastrointestinal: Mild tenderness to palpation right lower quadrant.. No distention. There is no CVA tenderness. Genitourinary: deferred Musculoskeletal: Nontender with normal range of motion in all extremities. No lower extremity tenderness nor edema. Neurologic:  Normal speech and language. No gross focal neurologic deficits are appreciated. Skin:  Skin is warm, dry and intact. No rash noted. Psychiatric: Mood and affect are normal.  Patient exhibits appropriate insight and judgment.  ____________________________________________    LABS (pertinent positives/negatives)  Labs Reviewed - No data to display  ____________________________________________   EKG  None  ____________________________________________    RADIOLOGY I have personally reviewed any xrays that were ordered on this patient: CT shows a 3 mm stone  ____________________________________________   PROCEDURES  Procedure(s) performed: none  Critical Care performed: none  ____________________________________________   INITIAL IMPRESSION / ASSESSMENT AND PLAN / ED COURSE  Pertinent labs & imaging results that were available during my care of the patient were reviewed by me and considered in my medical decision making (see chart for details).  Patient's presentation is suspicious for ureterolithiasis versus appendicitis. I feel kidney stone is more likely given the relatively abrupt onset of pain and not significantly tender to palpation in the right lower quadrant. We will give IV analgesics, nausea medication, fluids and send for CT renal stone study  After treatment patient's pain is completely resolved. His labs urine and vitals show no evidence of infection. I discussed with him the need to follow-up with urology as well as return precautions. I will write him analgesics, nausea medication and Flomax. Patient and father agreed plan  ____________________________________________   FINAL CLINICAL IMPRESSION(S) / ED DIAGNOSES  Final diagnoses:  Flank pain, acute  Ureterolithiasis     Jene Everyobert Shannyn Jankowiak, MD 06/26/15 1504

## 2015-06-26 NOTE — ED Notes (Signed)
Patient presents to the ED with right lower quadrant pain that started at 5:20am.  Patient describes pain as sharp and intense.  Patient reports nausea, denies vomiting and diarrhea.  Appears uncomfortable.

## 2015-06-28 ENCOUNTER — Ambulatory Visit (INDEPENDENT_AMBULATORY_CARE_PROVIDER_SITE_OTHER): Payer: Self-pay | Admitting: Cardiovascular Disease

## 2015-06-28 ENCOUNTER — Encounter: Payer: Self-pay | Admitting: Cardiovascular Disease

## 2015-06-28 VITALS — BP 110/64 | HR 59 | Ht 70.0 in | Wt 157.2 lb

## 2015-06-28 DIAGNOSIS — F1010 Alcohol abuse, uncomplicated: Secondary | ICD-10-CM | POA: Insufficient documentation

## 2015-06-28 DIAGNOSIS — R079 Chest pain, unspecified: Secondary | ICD-10-CM

## 2015-06-28 DIAGNOSIS — E876 Hypokalemia: Secondary | ICD-10-CM

## 2015-06-28 DIAGNOSIS — R002 Palpitations: Secondary | ICD-10-CM

## 2015-06-28 DIAGNOSIS — I456 Pre-excitation syndrome: Secondary | ICD-10-CM | POA: Insufficient documentation

## 2015-06-28 NOTE — Assessment & Plan Note (Signed)
Rare episodes of palpitations waking him up at night Etiology unclear Nothing ever caught on EKG. Multiple trips to the emergency room, was on telemetry in the hospital in November. Recommended if symptoms come back again, a 30 day monitor will be ordered. He could call us to order this.  He feels well with no complaints. Recommended he avoid alcohol as this can cause tachycardia. Would also lead to low potassium which was seen on recent lab work. Recommended he start a potassium supplement pill 2 or 3 days per week with goal potassium 4.0 or greater

## 2015-06-28 NOTE — Assessment & Plan Note (Signed)
Unclear if this is causing any symptoms or benign finding. Recommended he call our office if he has more tachycardia, Holter for 48 hours or 30 day monitor could be ordered depending on his symptoms and frequency If needed, referral to EP could be made

## 2015-06-28 NOTE — Assessment & Plan Note (Signed)
Normal stress test, echocardiogram, CT scan of the chest. All studies were reviewed with him. No indication for coronary disease or anatomical problems Recommended he monitor his heart rate when he has symptoms using his phone, pulse meter

## 2015-06-28 NOTE — Assessment & Plan Note (Signed)
Lower extremities are very cool, clammy, concerning for chronic alcohol use He does report heavy use in the past, would likely explain his low potassium and possibly arrhythmia Recommended alcohol cessation

## 2015-06-28 NOTE — Progress Notes (Signed)
Patient ID: Jon Steele, male    DOB: 1980/09/10, 34 y.o.   MRN: [id number]  HPI Comments: [name] is a pleasant 34 year old gentleman with history of alcohol abuse, depression/anxiety, presenting for evaluation of his chest discomfort, tachycardia symptoms.  Symptoms dated back in to September 2016. Around 2 months ago he developed left chest discomfort, pressure radiating into his left arm down to his hand, intensity 5/10. He went to the emergency room 04/29/2015, workup looked within normal limits, potassium was 3.1. He had been drinking heavily prior to that.  He had EGD, stretching, requested to stay on his PPI.  In 06/09/2015, woke up at 4 AM with tachycardia, palpitations. Felt a funny sensation in his chest like muscles were moving, some chest discomfort. Again went to the emergency room, workup was essentially normal  workup revealing low potassium  06/23/2015 at palpitations, chest discomfort, warm feeling across his chest that seemed to spread, had lightheadedness. Prior to that had increased his Celexa from 10 up to 20 mg This seemed to happen during the daytime Emergency room again with workup unrevealing except for low potassium  Had kidney stone 06/29/2015, again back to the emergency room given several medications including Flomax, pain medication Symptoms have since resolved  Reports that he has stopped drinking he is concerned about EKG showing WPW Previously seen by cardiology at [facility] and wants to investigate the WPW  He has been taking atenolol 25 mg daily Many of his symptoms have been at nighttime, one event on November 24 during the daytime  EKG on today's visit shows normal sinus rhythm with rate 59 bpm,.wave noted concerning for WPW prior EKG from 05/16/2015 reviewed, similar to today's EKG     No Known Allergies  Current Outpatient Prescriptions on File Prior to Visit  Medication Sig Dispense Refill  . atenolol (TENORMIN) 25 MG tablet Take 25 mg  by mouth every evening.    . naproxen (NAPROSYN) 500 MG tablet Take 1 tablet (500 mg total) by mouth 2 (two) times daily with a meal. 20 tablet 2  . ondansetron (ZOFRAN) 4 MG tablet Take 1 tablet (4 mg total) by mouth daily as needed for nausea or vomiting. 20 tablet 1  . oxyCODONE-acetaminophen (ROXICET) 5-325 MG tablet Take 1 tablet by mouth every 6 (six) hours as needed. 20 tablet 0  . pantoprazole (PROTONIX) 40 MG tablet Take 40 mg by mouth 2 (two) times daily before a meal.    . tamsulosin (FLOMAX) 0.4 MG CAPS capsule Take 1 capsule (0.4 mg total) by mouth daily. 7 capsule 0   No current facility-administered medications on file prior to visit.    Past Medical History  Diagnosis Date  . Tobacco abuse   . Dysphagia   . Hypertension   . Anxiety   . WPW (Wolff-Parkinson-White syndrome)   . Kidney stones     Past Surgical History  Procedure Laterality Date  . Strabismus surgery      Social History  reports that he has quit smoking. His smoking use included Cigarettes. He has a 15 pack-year smoking history. He has never used smokeless tobacco. He reports that he does not drink alcohol or use illicit drugs.  Family History family history includes Clotting disorder in his father.    Review of Systems  Constitutional: Negative.   HENT: Negative.   Respiratory: Positive for chest tightness.   Cardiovascular: Positive for palpitations.  Gastrointestinal: Negative.   Musculoskeletal: Negative.   Skin: Negative.   Neurological: Negative.  Hematological: Negative.   Psychiatric/Behavioral: Negative.   All other systems reviewed and are negative.   BP 110/64 mmHg  Pulse 59  Ht 5\' 10"  (1.778 m)  Wt 157 lb 4 oz (71.328 kg)  BMI 22.56 kg/m2   Physical Exam  Constitutional: He is oriented to person, place, and time. He appears well-developed and well-nourished.  HENT:  Head: Normocephalic.  Nose: Nose normal.  Mouth/Throat: Oropharynx is clear and moist.  Eyes:  Conjunctivae are normal. Pupils are equal, round, and reactive to light.  Neck: Normal range of motion. Neck supple. No JVD present.  Cardiovascular: Normal rate, regular rhythm, normal heart sounds and intact distal pulses.  Exam reveals no gallop and no friction rub.   No murmur heard. Pulmonary/Chest: Effort normal and breath sounds normal. No respiratory distress. He has no wheezes. He has no rales. He exhibits no tenderness.  Abdominal: Soft. Bowel sounds are normal. He exhibits no distension. There is no tenderness.  Musculoskeletal: Normal range of motion. He exhibits no edema or tenderness.  Lymphadenopathy:    He has no cervical adenopathy.  Neurological: He is alert and oriented to person, place, and time. Coordination normal.  Skin: Skin is warm and dry. No rash noted. No erythema.  Psychiatric: He has a normal mood and affect. His behavior is normal. Judgment and thought content normal.

## 2015-06-28 NOTE — Patient Instructions (Signed)
You are doing well. No medication changes were made.  Ok to wean down or off the atenolol depending on how you feel  Please call the office if you would like a holter 24 to 48 hrs, or a 30 day monitor  Please call us if you have new issues that need to be addressed before your next appt.

## 2015-06-28 NOTE — Assessment & Plan Note (Signed)
Atypical chest pain, prior workup as detailed above was normal

## 2015-06-28 NOTE — Assessment & Plan Note (Signed)
Unclear if his low potassium could be contributing to some of his symptoms. Initially 3.1 on arrival to the hospital in September 2016 Likely secondary to alcohol. Unable to exclude other causes. He denies diarrhea. Unclear if diet is an issue. Recommended he do supplemental potassium several days per week

## 2015-07-01 ENCOUNTER — Ambulatory Visit: Payer: Self-pay | Admitting: Pulmonary Disease

## 2015-07-01 ENCOUNTER — Telehealth: Payer: Self-pay

## 2015-07-01 DIAGNOSIS — R000 Tachycardia, unspecified: Secondary | ICD-10-CM

## 2015-07-01 NOTE — Addendum Note (Signed)
Addended by: Marilynne HalstedMOODY, Avionna Bower R on: 07/01/2015 05:28 PM   Modules accepted: Orders

## 2015-07-01 NOTE — Telephone Encounter (Signed)
Order placed w/ [employer] for 30 day monitor for tachycardia.  Spoke w/ pt.  Advised him that he will receive a call from [employer] verifying mailing address and they will ship it to him. He is appreciative and will call back w/ any questions or concerns.

## 2015-07-01 NOTE — Telephone Encounter (Signed)
See note Below.

## 2015-07-01 NOTE — Telephone Encounter (Signed)
Pt called states he has decided he wants to wear a 30 day monitor. Please call.

## 2015-07-06 ENCOUNTER — Encounter (INDEPENDENT_AMBULATORY_CARE_PROVIDER_SITE_OTHER): Payer: Self-pay

## 2015-07-06 DIAGNOSIS — R000 Tachycardia, unspecified: Secondary | ICD-10-CM

## 2015-08-31 ENCOUNTER — Encounter: Payer: Self-pay | Admitting: Nurse Practitioner

## 2015-08-31 ENCOUNTER — Ambulatory Visit (INDEPENDENT_AMBULATORY_CARE_PROVIDER_SITE_OTHER): Payer: Self-pay | Admitting: Nurse Practitioner

## 2015-08-31 VITALS — BP 133/91 | HR 69 | Ht 70.0 in | Wt 163.0 lb

## 2015-08-31 DIAGNOSIS — R079 Chest pain, unspecified: Secondary | ICD-10-CM

## 2015-08-31 DIAGNOSIS — I456 Pre-excitation syndrome: Secondary | ICD-10-CM

## 2015-08-31 DIAGNOSIS — R002 Palpitations: Secondary | ICD-10-CM

## 2015-08-31 NOTE — Patient Instructions (Signed)
Medication Instructions:  Your physician recommends that you continue on your current medications as directed. Please refer to the Current Medication list given to you today.   Labwork: none  Testing/Procedures: none  Follow-Up: Your physician wants you to follow-up in: one year with Dr. Gollan.  You will receive a reminder letter in the mail two months in advance. If you don't receive a letter, please call our office to schedule the follow-up appointment.   Any Other Special Instructions Will Be Listed Below (If Applicable).     If you need a refill on your cardiac medications before your next appointment, please call your pharmacy.   

## 2015-08-31 NOTE — Progress Notes (Signed)
Office Visit    Patient Name: Jon Steele Date of Encounter: 08/31/2015  Primary Care Provider:  Nicolasa Ducking, MD Primary Cardiologist:  Concha Se, MD   Chief Complaint    35 year old male with a history of atypical chest pain, palpitations, and Wolff-Parkinson-White pattern on his ECG who presents for follow-up.  Past Medical History    Past Medical History  Diagnosis Date  . Tobacco abuse     a. quit 2016.  Marland Kitchen Dysphagia   . Anxiety   . WPW (Wolff-Parkinson-White syndrome)   . Kidney stones   . Chest pain     a. 05/2015 MV: EF 55-65%, no ischemia.  . Palpitations     a. 05/2015 Echo: EF 55-65%; b. 06/2015 30 Day Event Monitor: sinus rhythm/sinus tach - no ectopy.   Past Surgical History  Procedure Laterality Date  . Strabismus surgery      Allergies  No Known Allergies  History of Present Illness     35 year old male with a past medical history of tobacco and alcohol abuse as well as atypical chest pain, palpitations, and Wolff-Parkinson-White pattern on his ECG. He had several ER visits related to chest pain, palpitations, and says currently kidney stones in late 2016. He underwent stress testing in November 2016 which was negative for ischemia. Echo showed normal LV function. Following his last visit here, he called and reported more palpitations and an event monitor was subsequently placed. This was reviewed today and did not show any significant arrhythmias. He had several reported events of either lightheadedness or chest tightness, all associated with sinus rhythm or sinus tachycardia.  Since removing the event monitor, he has been feeling well. He does have occasional lightheadedness, which he now thinks is related to using Celexa for his anxiety. He no longer takes atenolol, as that was discontinued following his last visit. He has not had any recent chest pain or palpitations. He denies PND, orthopnea, dizziness, syncope, edema, or early satiety.  Home  Medications    Prior to Admission medications   Medication Sig Start Date End Date Taking? Authorizing Provider  aspirin 81 MG tablet Take 81 mg by mouth daily.   Yes Historical Provider, MD  citalopram (CELEXA) 20 MG tablet Take 20 mg by mouth daily.   Yes Historical Provider, MD  pantoprazole (PROTONIX) 40 MG tablet Take 40 mg by mouth 2 (two) times daily before a meal.   Yes Historical Provider, MD    Review of Systems    Occasional lightheadedness.  He denies chest pain, palpitations, dyspnea, pnd, orthopnea, n, v, syncope, edema, weight gain, or early satiety. All other systems reviewed and are otherwise negative except as noted above.  Physical Exam    VS:  BP 133/91 mmHg  Pulse 69  Ht  (1.778 m)  Wt 163 lb (73.936 kg)  BMI 23.39 kg/m2 , BMI Body mass index is 23.39 kg/(m^2). GEN: Well nourished, well developed, in no acute distress. HEENT: normal. Neck: Supple, no JVD, carotid bruits, or masses. Cardiac: RRR, no murmurs, rubs, or gallops. No clubbing, cyanosis, edema.  Radials/DP/PT 2+ and equal bilaterally.  Respiratory:  Respirations regular and unlabored, clear to auscultation bilaterally. GI: Soft, nontender, nondistended, BS + x 4. MS: no deformity or atrophy. Skin: warm and dry, no rash. Neuro:  Strength and sensation are intact. Psych: Normal affect.  Accessory Clinical Findings    Old ECGs reviewed with patient to show preexcitation. Event monitor summary reviewed.  Assessment & Plan  1.  Palpitations/Wolff-Parkinson-White pattern: Patient recently wore an event monitor which showed no evidence of tachycardias or other ectopy. All symptoms were reported during periods of sinus rhythm or sinus tachycardia. He previously had normal echo and stress testing. If he were to develop tachycardia, I would refer to electrophysiology for further management of WPW and possibly ablation.  2. History of chest pain: He has not had any chest pain recently. As above, he  has previously had negative stress testing. No further workup at this time.  3. Anxiety: He is taking low-dose Celexa. He feels that this may be contributing to his lightheadedness and he has discussed this with his primary care provider.  4. Disposition: Follow-up with Dr. Mariah Milling in 6-12 months or sooner if necessary. He will contact us if he is having any tachycardia, at which point we will will refer him to electrophysiology.   Nicolasa Ducking, NP 08/31/2015, 5:06 PM

## 2016-02-19 IMAGING — CT CT ANGIO CHEST
1 of 2 series · 18 of 30 positions shown · IV contrast (APPLIED)
Comparison: Chest radiographs 06/09/2015

CLINICAL DATA: Left chest pain radiating to the left arm. Symptoms
began 6 weeks ago with shortness of breath. Extreme fatigue.
Symptoms slowly worsening.

EXAM:
CT ANGIOGRAPHY CHEST WITH CONTRAST
TECHNIQUE: Multidetector CT imaging of the chest was performed using the
standard protocol during bolus administration of intravenous
contrast. Multiplanar CT image reconstructions and MIPs were
obtained to evaluate the vascular anatomy.
CONTRAST:  75mL OMNIPAQUE IOHEXOL 300 MG/ML  SOLN

[Series 5: pe 1.0 thins · axial · 0.72mm/px · z∈[-834,-530]mm · 18 of 342 slices shown]
[im 19/342  lung]
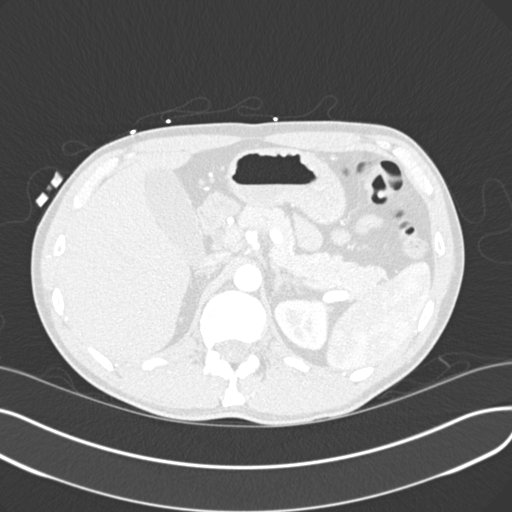
[im 38/342  mediastinal]
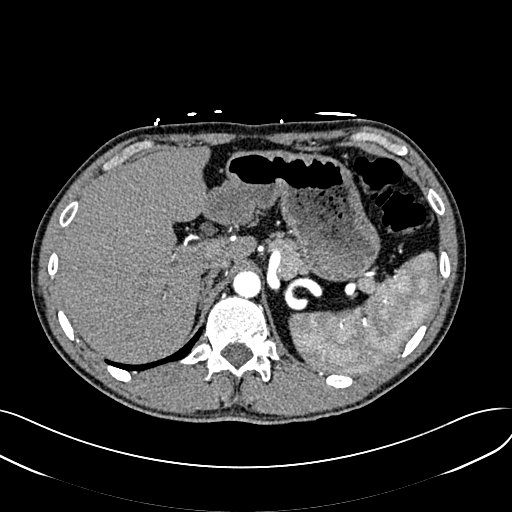
[im 57/342  lung]
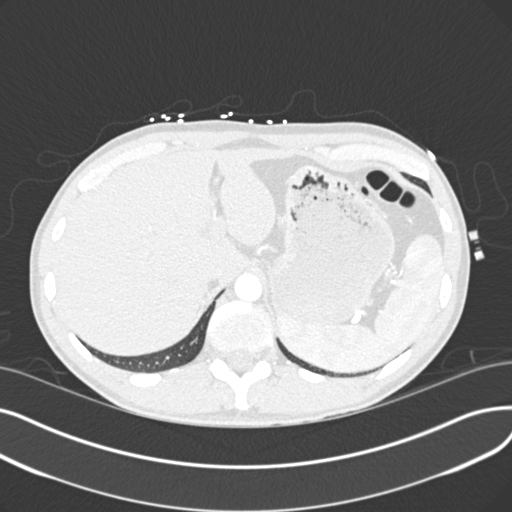
[im 76/342  mediastinal]
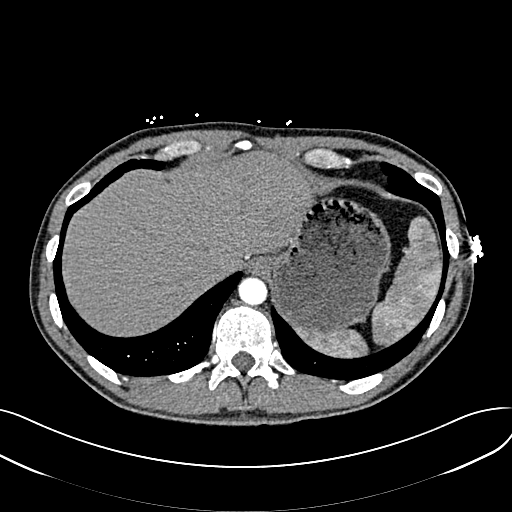
[im 95/342  lung]
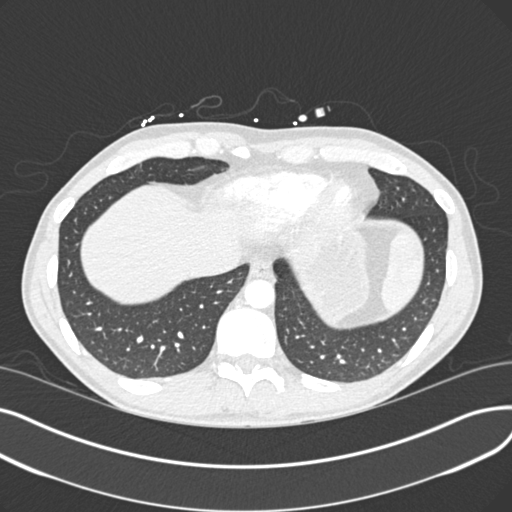
[im 114/342  mediastinal]
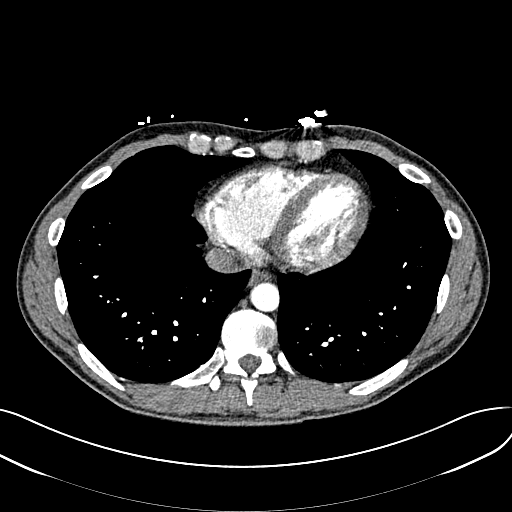
[im 133/342  lung]
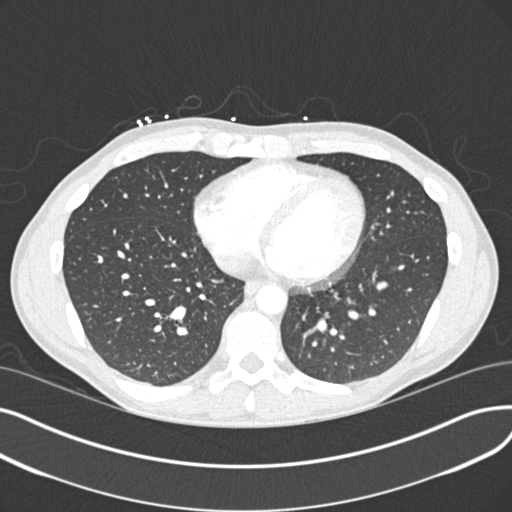
[im 152/342  mediastinal]
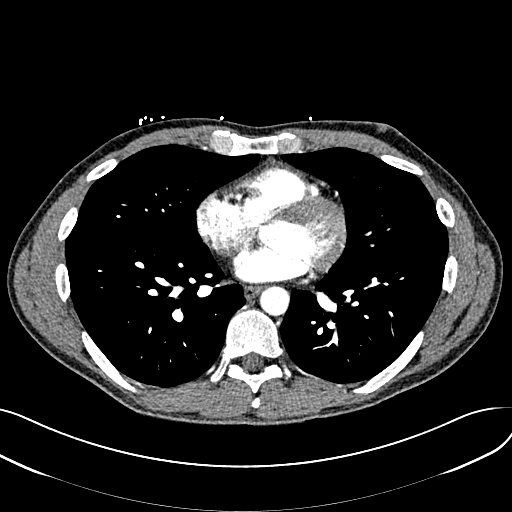
[im 162/342  lung]
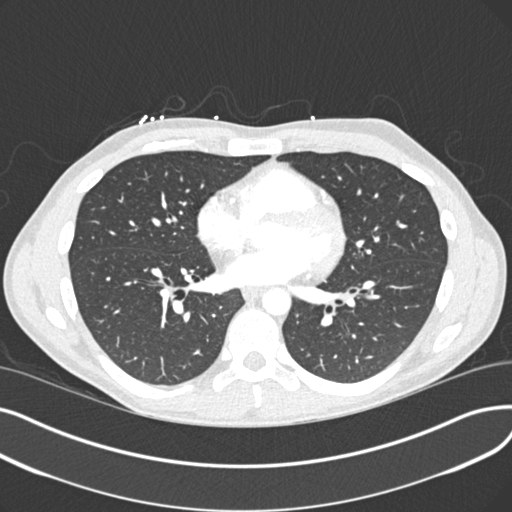
[im 171/342  mediastinal]
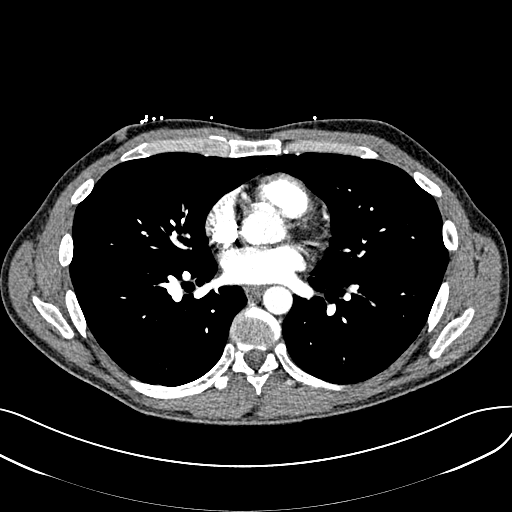
[im 190/342  lung]
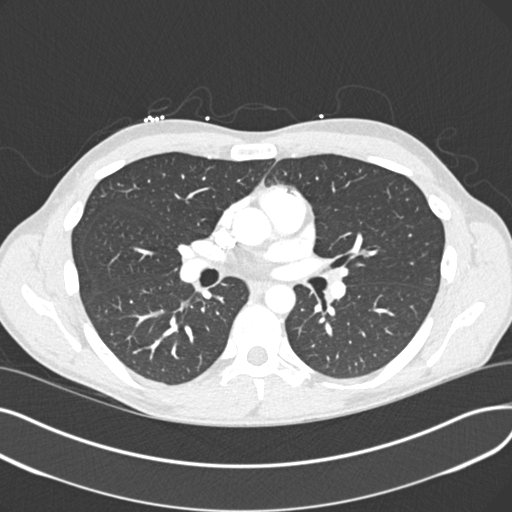
[im 209/342  mediastinal]
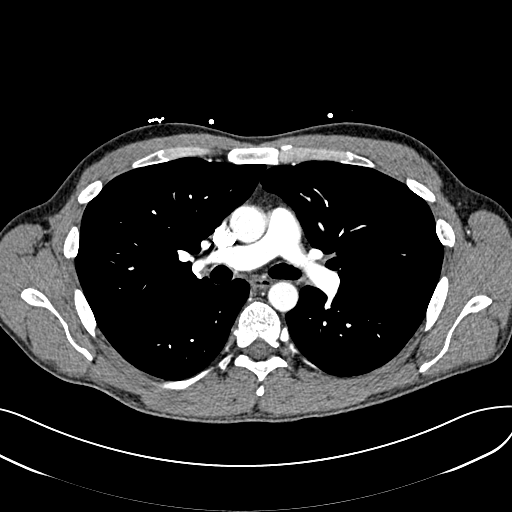
[im 228/342  lung]
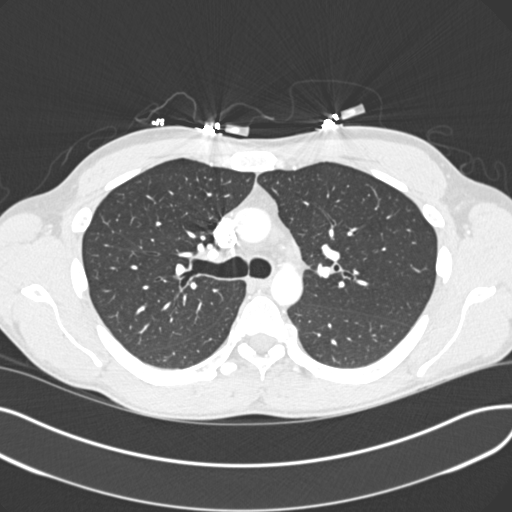
[im 247/342  mediastinal]
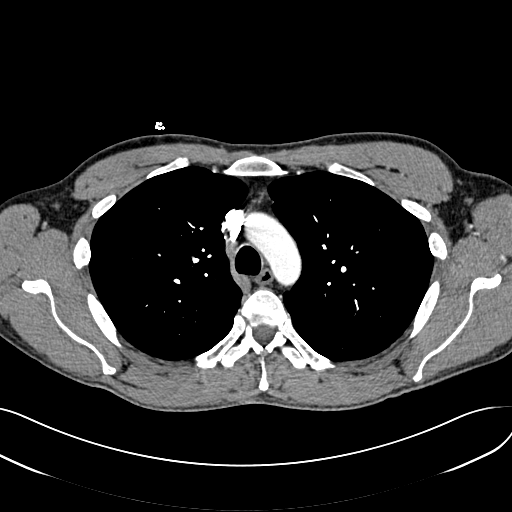
[im 266/342  lung]
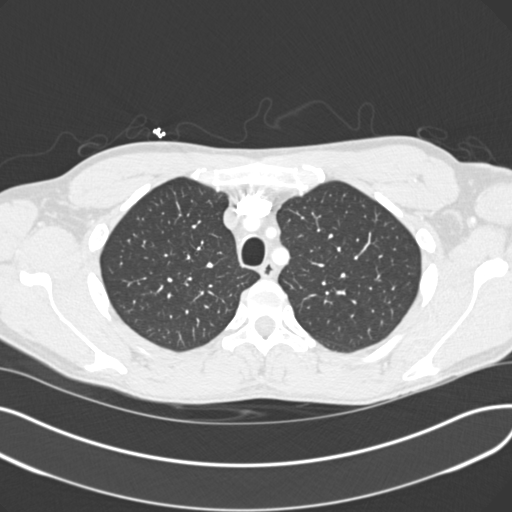
[im 285/342  mediastinal]
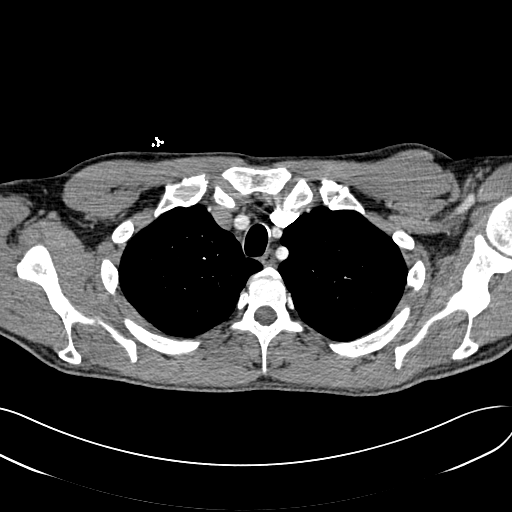
[im 304/342  lung]
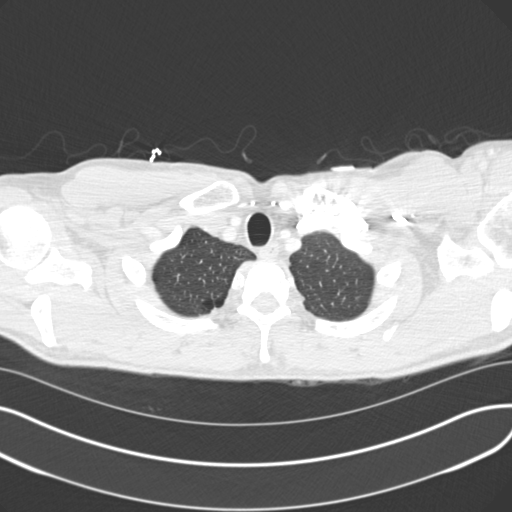
[im 323/342  mediastinal]
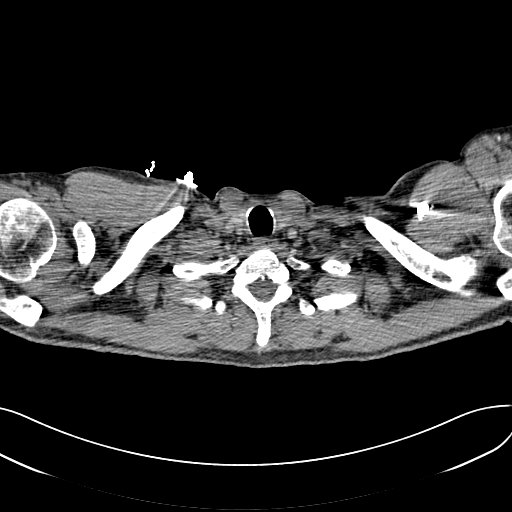

[18 of 30 positions shown; findings below may reference images not displayed]

FINDINGS: Pulmonary arterial opacification is adequate without evidence of
emboli. The heart is normal in size. No gross coronary artery
calcification is identified on this contrast-enhanced study. No
enlarged axillary, mediastinal, or hilar lymph nodes are identified.

There is no pleural or pericardial effusion. Major airways are
patent. Minimal pleural thickening is noted in the lung apices.
There is no lung consolidation, nodule, or mass.

The visualized portion of the upper abdomen is unremarkable. No
acute osseous abnormality is identified.

Review of the MIP images confirms the above findings.
IMPRESSION: No evidence of pulmonary emboli or other acute abnormality in the
chest.

## 2016-12-04 ENCOUNTER — Telehealth: Payer: Self-pay | Admitting: Cardiovascular Disease

## 2016-12-04 NOTE — Telephone Encounter (Signed)
3 attempts to schedule fu from recall list .  Deleting recall  12 month fu per ckout 08/31/15

## 2017-11-26 ENCOUNTER — Ambulatory Visit (INDEPENDENT_AMBULATORY_CARE_PROVIDER_SITE_OTHER): Payer: BLUE CROSS/BLUE SHIELD | Admitting: Neurology

## 2017-11-26 ENCOUNTER — Other Ambulatory Visit: Payer: Self-pay

## 2017-11-26 ENCOUNTER — Encounter: Payer: Self-pay | Admitting: Neurology

## 2017-11-26 VITALS — BP 135/82 | HR 86 | Resp 16 | Ht 70.0 in | Wt 180.0 lb

## 2017-11-26 DIAGNOSIS — R42 Dizziness and giddiness: Secondary | ICD-10-CM

## 2017-11-26 MED ORDER — NORTRIPTYLINE HCL 25 MG PO CAPS: 50.0000 mg | ORAL_CAPSULE | Freq: Every day | ORAL | 11 refills | Status: DC

## 2017-11-26 NOTE — Progress Notes (Signed)
PATIENT: Jon Steele DOB: 29-Jul-1981  Chief Complaint  Patient presents with  . Dizziness    Rm. 4.  Intermittent dizziness, balance disturbance, h/a's onset 4-6 wks. ago, associated with movement, position changes.  Sts. seen by Dr. Malvin Johns at the [facility] last wk. for same, sts. was dx. with h/a's, vertigo, started on Nortriptyline  qhs.  Wanted to see Dr. Terrace Arabia for a 2nd opinion.  Hx. of bilat surgery for strabismus. Has an appt. with opthalmology today for an eye exam/fim  . Balance Disturbance  . Headache     HISTORICAL  Jon Steele is a 37 year old male, seen in refer by his primary care physician Dr. Nicolasa Ducking for evaluation of dizziness, mild headaches, initial evaluation was on November 26, 2017.  I reviewed and summarized the referring note, he had a history of anxiety, was previously treated with Celexa, and not taking any medications now, kidney stone, right eye strabismus surgery in 2007, he denies a previous history of migraine headaches,  In March 2019, while working at his sink preparing evening dinner, he suddenly felt rising sensation from lower cervical region top of his head, tingling sensation, he also felt mild dizziness, he has to sit down for a while to improve his symptoms,  But since then, he noticed intermittent dizziness throughout the day, he denies vertigo, dizziness was used to describe the sensation of feeling off, he has intermittent bilateral frontal, temporal region pressure headaches, with light, noise sensitivity, feeling mild nausea, but he denies lateralized motor or sensory deficit, was able to continue his day without much difficulty.  He recently noticed mild worsening of his vision, intermittent binocular double vision when looking to the left and up  He was seen by outside neurologist Dr. Hale Bogus a week ago, was given a prescription of nortriptyline 10 mg every night, he tolerated very well, reported mild improvement after taking  medications.  REVIEW OF SYSTEMS: Full 14 system review of systems performed and notable only for as above  ALLERGIES: No Known Allergies  HOME MEDICATIONS: Current Outpatient Medications  Medication Sig Dispense Refill  . aspirin 81 MG tablet Take 81 mg by mouth daily.    . citalopram (CELEXA) 20 MG tablet Take 20 mg by mouth daily.    . nortriptyline (PAMELOR) 10 MG capsule   0  . pantoprazole (PROTONIX) 40 MG tablet Take 40 mg by mouth 2 (two) times daily before a meal.     No current facility-administered medications for this visit.     PAST MEDICAL HISTORY: Past Medical History:  Diagnosis Date  . Anxiety   . Chest pain    a. 05/2015 MV: EF 55-65%, no ischemia.  Marland Kitchen Dysphagia   . Headache   . Kidney stones   . Palpitations    a. 05/2015 Echo: EF 55-65%; b. 06/2015 30 Day Event Monitor: sinus rhythm/sinus tach - no ectopy.  . Tobacco abuse    a. quit 2016.  Marland Kitchen Vision abnormalities   . WPW (Wolff-Parkinson-White syndrome)     PAST SURGICAL HISTORY: Past Surgical History:  Procedure Laterality Date  . STRABISMUS SURGERY      FAMILY HISTORY: Family History  Problem Relation Age of Onset  . Clotting disorder Father   . Healthy Sister   . Healthy Brother   . Healthy Brother     SOCIAL HISTORY:  Social History   Socioeconomic History  . Marital status: Single    Spouse name: Not on file  . Number of children:  Not on file  . Years of education: Not on file  . Highest education level: Not on file  Occupational History  . Not on file  Social Needs  . Financial resource strain: Not on file  . Food insecurity:    Worry: Not on file    Inability: Not on file  . Transportation needs:    Medical: Not on file    Non-medical: Not on file  Tobacco Use  . Smoking status: Former Smoker    Packs/day: 1.00    Years: 15.00    Pack years: 15.00    Types: Cigarettes  . Smokeless tobacco: Never Used  Substance and Sexual Activity  . Alcohol use: No    Comment:  daily, beers  . Drug use: No  . Sexual activity: Yes  Lifestyle  . Physical activity:    Days per week: Not on file    Minutes per session: Not on file  . Stress: Not on file  Relationships  . Social connections:    Talks on phone: Not on file    Gets together: Not on file    Attends religious service: Not on file    Active member of club or organization: Not on file    Attends meetings of clubs or organizations: Not on file    Relationship status: Not on file  . Intimate partner violence:    Fear of current or ex partner: Not on file    Emotionally abused: Not on file    Physically abused: Not on file    Forced sexual activity: Not on file  Other Topics Concern  . Not on file  Social History Narrative  . Not on file     PHYSICAL EXAM   Vitals:   11/26/17 0724  BP: 135/82  Pulse: 86  Resp: 16  Weight: 180 lb (81.6 kg)  Height:  (1.778 m)    Not recorded      Body mass index is 25.83 kg/m.  PHYSICAL EXAMNIATION:  Gen: NAD, conversant, well nourised, obese, well groomed                     Cardiovascular: Regular rate rhythm, no peripheral edema, warm, nontender. Eyes: Conjunctivae clear without exudates or hemorrhage Neck: Supple, no carotid bruits. Pulmonary: Clear to auscultation bilaterally   NEUROLOGICAL EXAM:  MENTAL STATUS: Speech:    Speech is normal; fluent and spontaneous with normal comprehension.  Cognition:     Orientation to time, place and person     Normal recent and remote memory     Normal Attention span and concentration     Normal Language, naming, repeating,spontaneous speech     Fund of knowledge   CRANIAL NERVES: CN II: Visual fields are full to confrontation. Fundoscopic exam is normal with sharp discs and no vascular changes. Pupils are round equal and briskly reactive to light. CN III, IV, VI: Right eye exo/inferiortropia, left eye is the dominant eye, CN V: Facial sensation is intact to pinprick in all 3 divisions  bilaterally. Corneal responses are intact.  CN VII: Face is symmetric with normal eye closure and smile. CN VIII: Hearing is normal to rubbing fingers CN IX, X: Palate elevates symmetrically. Phonation is normal. CN XI: Head turning and shoulder shrug are intact CN XII: Tongue is midline with normal movements and no atrophy.  MOTOR: There is no pronator drift of out-stretched arms. Muscle bulk and tone are normal. Muscle strength is normal.  REFLEXES: Reflexes  are 2+ and symmetric at the biceps, triceps, knees, and ankles. Plantar responses are flexor.  SENSORY: Intact to light touch, pinprick, positional sensation and vibratory sensation are intact in fingers and toes.  COORDINATION: Rapid alternating movements and fine finger movements are intact. There is no dysmetria on finger-to-nose and heel-knee-shin.    GAIT/STANCE: Posture is normal. Gait is steady with normal steps, base, arm swing, and turning. Heel and toe walking are normal. Tandem gait is normal.  Romberg is absent.   DIAGNOSTIC DATA (LABS, IMAGING, TESTING) - I reviewed patient records, labs, notes, testing and imaging myself where available.   ASSESSMENT AND PLAN  Jon Steele is a 37 y.o. male   Dizziness, mild headaches,  Nonspecific, there is some migraine features, such as light, noise sensitive, worsening by movement,  Continue nortriptyline, titrating dose to 25 mg 2 tablets every night,  NSAIDs, Tylenol as needed,  Return to clinic for new issues, is a 37 year old male  Levert Feinstein, M.D. Ph.D.  [facility] [address] [place], [place] [address] Ph: [phone] Fax: [phone]  CC: Referring Provider

## 2022-10-02 ENCOUNTER — Ambulatory Visit: Payer: 59 | Admitting: Family Medicine

## 2022-10-02 ENCOUNTER — Encounter: Payer: Self-pay | Admitting: Family Medicine

## 2022-10-02 VITALS — BP 110/68 | HR 87 | Temp 98.3°F | Ht 70.0 in | Wt 155.0 lb

## 2022-10-02 DIAGNOSIS — K4090 Unilateral inguinal hernia, without obstruction or gangrene, not specified as recurrent: Secondary | ICD-10-CM

## 2022-10-02 NOTE — Progress Notes (Signed)
New Patient Office Visit  Subjective    Patient ID: Jon Steele, male    DOB: [birth year]  Age: 42 y.o. MRN: [id number]  CC:  Chief Complaint  Patient presents with   Establish Care    NP/establish care discuss possible hernia. Patient not fasting.     HPI Jon Steele presents to establish care. Encounter Diagnoses  Name Primary?   Non-recurrent unilateral inguinal hernia without obstruction or gangrene Yes   For establishment of care.  Recently moved back to the area from [place].  He is a Librarian, academic at [employer].  He is generally healthy.  He believes that he may have a hernia.  He is interested in doing something about that.  Outpatient Encounter Medications as of 10/02/2022  Medication Sig   [DISCONTINUED] nortriptyline (PAMELOR) 25 MG capsule Take 2 capsules (50 mg total) by mouth at bedtime.   No facility-administered encounter medications on file as of 10/02/2022.    Past Medical History:  Diagnosis Date   Anxiety    Chest pain    a. 05/2015 MV: EF 55-65%, no ischemia.   Dysphagia    Headache    Kidney stones    Palpitations    a. 05/2015 Echo: EF 55-65%; b. 06/2015 30 Day Event Monitor: sinus rhythm/sinus tach - no ectopy.   Tobacco abuse    a. quit 2016.   Vision abnormalities    WPW (Wolff-Parkinson-White syndrome)     Past Surgical History:  Procedure Laterality Date   STRABISMUS SURGERY      Family History  Problem Relation Age of Onset   Healthy Mother    Clotting disorder Father    Healthy Sister    Healthy Brother    Healthy Brother     Social History   Socioeconomic History   Marital status: Single    Spouse name: Not on file   Number of children: Not on file   Years of education: Not on file   Highest education level: Not on file  Occupational History   Not on file  Tobacco Use   Smoking status: Former    Packs/day: 1.00    Years: 15.00    Total pack years: 15.00    Types: Cigarettes    Quit date: 2024    Years since  quitting: 0.1   Smokeless tobacco: Never  Vaping Use   Vaping Use: Every day  Substance and Sexual Activity   Alcohol use: Yes    Comment: social beer   Drug use: No   Sexual activity: Yes  Other Topics Concern   Not on file  Social History Narrative   Not on file   Social Determinants of Health   Financial Resource Strain: Not on file  Food Insecurity: Not on file  Transportation Needs: Not on file  Physical Activity: Not on file  Stress: Not on file  Social Connections: Not on file  Intimate Partner Violence: Not on file    Review of Systems  Constitutional: Negative.   HENT: Negative.    Eyes:  Negative for blurred vision, discharge and redness.  Respiratory: Negative.    Cardiovascular: Negative.   Gastrointestinal:  Negative for abdominal pain, nausea and vomiting.  Genitourinary: Negative.   Musculoskeletal: Negative.  Negative for myalgias.  Skin:  Negative for rash.  Neurological:  Negative for tingling, loss of consciousness and weakness.  Endo/Heme/Allergies:  Negative for polydipsia.        Objective    BP 110/68 (BP Location: Left Arm,  Patient Position: Sitting, Cuff Size: Normal)   Pulse 87   Temp 98.3 F (36.8 C) (Temporal)   Ht '5\' 10"'$  (1.778 m)   Wt 155 lb (70.3 kg)   SpO2 95%   BMI 22.24 kg/m   Physical Exam Constitutional:      General: He is not in acute distress.    Appearance: Normal appearance. He is not ill-appearing, toxic-appearing or diaphoretic.  HENT:     Head: Normocephalic and atraumatic.     Right Ear: External ear normal.     Left Ear: External ear normal.     Mouth/Throat:     Mouth: Mucous membranes are moist.     Pharynx: Oropharynx is clear. No oropharyngeal exudate or posterior oropharyngeal erythema.  Eyes:     General: No scleral icterus.       Right eye: No discharge.        Left eye: No discharge.     Extraocular Movements: Extraocular movements intact.     Conjunctiva/sclera: Conjunctivae normal.      Pupils: Pupils are equal, round, and reactive to light.  Cardiovascular:     Rate and Rhythm: Normal rate and regular rhythm.  Pulmonary:     Effort: Pulmonary effort is normal. No respiratory distress.     Breath sounds: Normal breath sounds.  Abdominal:     General: Bowel sounds are normal.     Tenderness: There is no abdominal tenderness. There is no guarding.     Hernia: A hernia is present. Hernia is present in the right inguinal area. There is no hernia in the left inguinal area.  Genitourinary:    Penis: Circumcised. No hypospadias, erythema, tenderness, discharge, swelling or lesions.      Testes:        Right: Mass, tenderness or swelling not present. Right testis is descended.        Left: Mass, tenderness or swelling not present. Left testis is descended.     Epididymis:     Right: Not inflamed or enlarged.     Left: Not inflamed or enlarged.  Musculoskeletal:     Cervical back: No rigidity or tenderness.  Lymphadenopathy:     Lower Body: No right inguinal adenopathy.  Skin:    General: Skin is warm and dry.  Neurological:     Mental Status: He is alert and oriented to person, place, and time.  Psychiatric:        Mood and Affect: Mood normal.        Behavior: Behavior normal.         Assessment & Plan:   Non-recurrent unilateral inguinal hernia without obstruction or gangrene -     Ambulatory referral to General Surgery     Return Will return fasting for physical exam..   Jon Maw, MD

## 2022-10-08 ENCOUNTER — Ambulatory Visit: Payer: 59 | Admitting: Family Medicine

## 2022-10-15 ENCOUNTER — Ambulatory Visit: Payer: Self-pay | Admitting: General Surgery

## 2022-11-13 ENCOUNTER — Encounter (HOSPITAL_BASED_OUTPATIENT_CLINIC_OR_DEPARTMENT_OTHER): Payer: Self-pay | Admitting: General Surgery

## 2022-11-13 ENCOUNTER — Other Ambulatory Visit: Payer: Self-pay

## 2022-11-13 NOTE — Progress Notes (Signed)
Chart reviewed with Dr. Desmond Lope, anesthesiologist, including history of Wolff-Parkinson White. Per aforesaid, okay to proceed with surgery as planned pending andy significant changes to patient health.

## 2022-11-15 MED ORDER — CHLORHEXIDINE GLUCONATE CLOTH 2 % EX PADS: 6.0000 | MEDICATED_PAD | Freq: Once | CUTANEOUS | Status: DC

## 2022-11-15 NOTE — Progress Notes (Signed)

## 2022-11-18 NOTE — Anesthesia Preprocedure Evaluation (Signed)
Anesthesia Evaluation  Patient identified by MRN, date of birth, ID band Patient awake    Reviewed: Allergy & Precautions, NPO status , Patient's Chart, lab work & pertinent test results  History of Anesthesia Complications Negative for: history of anesthetic complications  Airway Mallampati: II  TM Distance: >3 FB Neck ROM: Full    Dental no notable dental hx.    Pulmonary former smoker   Pulmonary exam normal        Cardiovascular Normal cardiovascular exam  Wolff-Parkinson-White syndrome   Neuro/Psych  Headaches  Anxiety        GI/Hepatic Neg liver ROS,,,Right inguinal hernia   Endo/Other  negative endocrine ROS    Renal/GU negative Renal ROS     Musculoskeletal negative musculoskeletal ROS (+)    Abdominal   Peds  Hematology negative hematology ROS (+)   Anesthesia Other Findings Day of surgery medications reviewed with patient.  Reproductive/Obstetrics negative OB ROS                              Anesthesia Physical Anesthesia Plan  ASA: 2  Anesthesia Plan: General   Post-op Pain Management: Tylenol PO (pre-op)*, Toradol IV (intra-op)* and Regional block*   Induction: Intravenous  PONV Risk Score and Plan: 2 and Treatment may vary due to age or medical condition, Dexamethasone, Midazolam and Ondansetron  Airway Management Planned: LMA  Additional Equipment: None  Intra-op Plan:   Post-operative Plan: Extubation in OR  Informed Consent: I have reviewed the patients History and Physical, chart, labs and discussed the procedure including the risks, benefits and alternatives for the proposed anesthesia with the patient or authorized representative who has indicated his/her understanding and acceptance.     Dental advisory given  Plan Discussed with: CRNA  Anesthesia Plan Comments:         Anesthesia Quick Evaluation

## 2022-11-19 ENCOUNTER — Ambulatory Visit (HOSPITAL_BASED_OUTPATIENT_CLINIC_OR_DEPARTMENT_OTHER): Payer: 59 | Admitting: Anesthesiology

## 2022-11-19 ENCOUNTER — Other Ambulatory Visit: Payer: Self-pay

## 2022-11-19 ENCOUNTER — Ambulatory Visit (HOSPITAL_BASED_OUTPATIENT_CLINIC_OR_DEPARTMENT_OTHER)
Admission: RE | Admit: 2022-11-19 | Discharge: 2022-11-19 | Disposition: A | Discharge status: Home / Self Care | Payer: 59 | Attending: General Surgery | Admitting: General Surgery

## 2022-11-19 ENCOUNTER — Encounter (HOSPITAL_BASED_OUTPATIENT_CLINIC_OR_DEPARTMENT_OTHER)
Admission: RE | Disposition: A | Discharge status: Home / Self Care | Payer: Self-pay | Source: Home / Self Care | Attending: General Surgery

## 2022-11-19 ENCOUNTER — Encounter (HOSPITAL_BASED_OUTPATIENT_CLINIC_OR_DEPARTMENT_OTHER): Payer: Self-pay | Admitting: General Surgery

## 2022-11-19 DIAGNOSIS — Z87891 Personal history of nicotine dependence: Secondary | ICD-10-CM

## 2022-11-19 DIAGNOSIS — F419 Anxiety disorder, unspecified: Secondary | ICD-10-CM

## 2022-11-19 DIAGNOSIS — K4090 Unilateral inguinal hernia, without obstruction or gangrene, not specified as recurrent: Secondary | ICD-10-CM | POA: Diagnosis present

## 2022-11-19 DIAGNOSIS — Z01818 Encounter for other preprocedural examination: Secondary | ICD-10-CM

## 2022-11-19 HISTORY — PX: INGUINAL HERNIA REPAIR: SHX194

## 2022-11-19 SURGERY — REPAIR, HERNIA, INGUINAL, ADULT
Anesthesia: General | Site: Inguinal | Laterality: Right

## 2022-11-19 MED ORDER — DEXAMETHASONE SODIUM PHOSPHATE 10 MG/ML IJ SOLN
INTRAMUSCULAR | Status: AC
  Filled 2022-11-19: qty 1

## 2022-11-19 MED ORDER — CELECOXIB 200 MG PO CAPS
200.0000 mg | ORAL_CAPSULE | ORAL | Status: AC
  Administered 2022-11-19: 200 mg via ORAL

## 2022-11-19 MED ORDER — ACETAMINOPHEN 500 MG PO TABS: 1000.0000 mg | ORAL_TABLET | ORAL | Status: AC

## 2022-11-19 MED ORDER — FENTANYL CITRATE (PF) 100 MCG/2ML IJ SOLN
INTRAMUSCULAR | Status: AC
  Filled 2022-11-19: qty 2

## 2022-11-19 MED ORDER — FENTANYL CITRATE (PF) 100 MCG/2ML IJ SOLN: 25.0000 ug | INTRAMUSCULAR | Status: DC | PRN

## 2022-11-19 MED ORDER — FENTANYL CITRATE (PF) 100 MCG/2ML IJ SOLN
INTRAMUSCULAR | Status: DC | PRN
  Administered 2022-11-19: 50 ug via INTRAVENOUS

## 2022-11-19 MED ORDER — PHENYLEPHRINE HCL (PRESSORS) 10 MG/ML IV SOLN
INTRAVENOUS | Status: DC | PRN
  Administered 2022-11-19: 80 ug via INTRAVENOUS

## 2022-11-19 MED ORDER — MIDAZOLAM HCL 2 MG/2ML IJ SOLN
2.0000 mg | Freq: Once | INTRAMUSCULAR | Status: AC
  Administered 2022-11-19: 2 mg via INTRAVENOUS

## 2022-11-19 MED ORDER — ONDANSETRON HCL 4 MG/2ML IJ SOLN
INTRAMUSCULAR | Status: DC | PRN
  Administered 2022-11-19: 4 mg via INTRAVENOUS

## 2022-11-19 MED ORDER — BUPIVACAINE-EPINEPHRINE (PF) 0.5% -1:200000 IJ SOLN
INTRAMUSCULAR | Status: DC | PRN
  Administered 2022-11-19: 20 mL via PERINEURAL

## 2022-11-19 MED ORDER — MIDAZOLAM HCL 2 MG/2ML IJ SOLN
INTRAMUSCULAR | Status: AC
  Filled 2022-11-19: qty 2

## 2022-11-19 MED ORDER — CEFAZOLIN SODIUM-DEXTROSE 2-4 GM/100ML-% IV SOLN
INTRAVENOUS | Status: AC
  Filled 2022-11-19: qty 100

## 2022-11-19 MED ORDER — DEXAMETHASONE SODIUM PHOSPHATE 10 MG/ML IJ SOLN
INTRAMUSCULAR | Status: DC | PRN
  Administered 2022-11-19: 10 mg via INTRAVENOUS

## 2022-11-19 MED ORDER — FENTANYL CITRATE (PF) 100 MCG/2ML IJ SOLN
100.0000 ug | Freq: Once | INTRAMUSCULAR | Status: AC
  Administered 2022-11-19: 100 ug via INTRAVENOUS

## 2022-11-19 MED ORDER — GABAPENTIN 300 MG PO CAPS
ORAL_CAPSULE | ORAL | Status: AC
  Filled 2022-11-19: qty 1

## 2022-11-19 MED ORDER — ACETAMINOPHEN 500 MG PO TABS
1000.0000 mg | ORAL_TABLET | Freq: Once | ORAL | Status: AC
  Administered 2022-11-19: 1000 mg via ORAL

## 2022-11-19 MED ORDER — LIDOCAINE 2% (20 MG/ML) 5 ML SYRINGE
INTRAMUSCULAR | Status: DC | PRN
  Administered 2022-11-19: 80 mg via INTRAVENOUS

## 2022-11-19 MED ORDER — CELECOXIB 200 MG PO CAPS
ORAL_CAPSULE | ORAL | Status: AC
  Filled 2022-11-19: qty 1

## 2022-11-19 MED ORDER — EPHEDRINE 5 MG/ML INJ
INTRAVENOUS | Status: AC
  Filled 2022-11-19: qty 5

## 2022-11-19 MED ORDER — OXYCODONE HCL 5 MG PO TABS: 5.0000 mg | ORAL_TABLET | Freq: Once | ORAL | Status: DC | PRN

## 2022-11-19 MED ORDER — CEFAZOLIN SODIUM-DEXTROSE 2-4 GM/100ML-% IV SOLN
2.0000 g | INTRAVENOUS | Status: AC
  Administered 2022-11-19: 2 g via INTRAVENOUS

## 2022-11-19 MED ORDER — ONDANSETRON HCL 4 MG/2ML IJ SOLN
INTRAMUSCULAR | Status: AC
  Filled 2022-11-19: qty 2

## 2022-11-19 MED ORDER — ACETAMINOPHEN 500 MG PO TABS
ORAL_TABLET | ORAL | Status: AC
  Filled 2022-11-19: qty 2

## 2022-11-19 MED ORDER — SUGAMMADEX SODIUM 200 MG/2ML IV SOLN
INTRAVENOUS | Status: DC | PRN
  Administered 2022-11-19: 200 mg via INTRAVENOUS

## 2022-11-19 MED ORDER — ROCURONIUM BROMIDE 100 MG/10ML IV SOLN
INTRAVENOUS | Status: DC | PRN
  Administered 2022-11-19: 60 mg via INTRAVENOUS

## 2022-11-19 MED ORDER — PROPOFOL 10 MG/ML IV BOLUS
INTRAVENOUS | Status: AC
  Filled 2022-11-19: qty 20

## 2022-11-19 MED ORDER — LACTATED RINGERS IV SOLN: INTRAVENOUS | Status: DC

## 2022-11-19 MED ORDER — ROCURONIUM BROMIDE 10 MG/ML (PF) SYRINGE
PREFILLED_SYRINGE | INTRAVENOUS | Status: AC
  Filled 2022-11-19: qty 10

## 2022-11-19 MED ORDER — BUPIVACAINE-EPINEPHRINE 0.25% -1:200000 IJ SOLN
INTRAMUSCULAR | Status: DC | PRN
  Administered 2022-11-19: 20 mL

## 2022-11-19 MED ORDER — PHENYLEPHRINE 80 MCG/ML (10ML) SYRINGE FOR IV PUSH (FOR BLOOD PRESSURE SUPPORT)
PREFILLED_SYRINGE | INTRAVENOUS | Status: AC
  Filled 2022-11-19: qty 10

## 2022-11-19 MED ORDER — GABAPENTIN 300 MG PO CAPS
300.0000 mg | ORAL_CAPSULE | ORAL | Status: AC
  Administered 2022-11-19: 300 mg via ORAL

## 2022-11-19 MED ORDER — OXYCODONE HCL 5 MG PO TABS
5.0000 mg | ORAL_TABLET | Freq: Four times a day (QID) | ORAL | 0 refills | Status: DC | PRN
Start: 2022-11-19 — End: 2023-11-21

## 2022-11-19 MED ORDER — AMISULPRIDE (ANTIEMETIC) 5 MG/2ML IV SOLN: 10.0000 mg | Freq: Once | INTRAVENOUS | Status: DC | PRN

## 2022-11-19 MED ORDER — BUPIVACAINE-EPINEPHRINE (PF) 0.25% -1:200000 IJ SOLN
INTRAMUSCULAR | Status: AC
  Filled 2022-11-19: qty 30

## 2022-11-19 MED ORDER — PROPOFOL 10 MG/ML IV BOLUS
INTRAVENOUS | Status: DC | PRN
  Administered 2022-11-19: 150 mg via INTRAVENOUS

## 2022-11-19 MED ORDER — LIDOCAINE 2% (20 MG/ML) 5 ML SYRINGE
INTRAMUSCULAR | Status: AC
  Filled 2022-11-19: qty 5

## 2022-11-19 MED ORDER — EPHEDRINE SULFATE (PRESSORS) 50 MG/ML IJ SOLN
INTRAMUSCULAR | Status: DC | PRN
  Administered 2022-11-19: 5 mg via INTRAVENOUS

## 2022-11-19 MED ORDER — BUPIVACAINE LIPOSOME 1.3 % IJ SUSP
INTRAMUSCULAR | Status: DC | PRN
  Administered 2022-11-19: 10 mL via PERINEURAL

## 2022-11-19 MED ORDER — OXYCODONE HCL 5 MG/5ML PO SOLN: 5.0000 mg | Freq: Once | ORAL | Status: DC | PRN

## 2022-11-19 SURGICAL SUPPLY — 47 items
ADH SKN CLS APL DERMABOND .7 (GAUZE/BANDAGES/DRESSINGS) ×1
APL PRP STRL LF DISP 70% ISPRP (MISCELLANEOUS) ×1
APL SKNCLS STERI-STRIP NONHPOA (GAUZE/BANDAGES/DRESSINGS)
BENZOIN TINCTURE PRP APPL 2/3 (GAUZE/BANDAGES/DRESSINGS) IMPLANT
BLADE CLIPPER SURG (BLADE) IMPLANT
BLADE HEX COATED 2.75 (ELECTRODE) ×1 IMPLANT
BLADE SURG 15 STRL LF DISP TIS (BLADE) ×1 IMPLANT
BLADE SURG 15 STRL SS (BLADE) ×1
CHLORAPREP W/TINT 26 (MISCELLANEOUS) ×1 IMPLANT
COVER BACK TABLE 60X90IN (DRAPES) ×1 IMPLANT
COVER MAYO STAND STRL (DRAPES) ×1 IMPLANT
DERMABOND ADVANCED .7 DNX12 (GAUZE/BANDAGES/DRESSINGS) ×1 IMPLANT
DRAIN PENROSE .5X12 LATEX STL (DRAIN) ×1 IMPLANT
DRAPE LAPAROTOMY TRNSV 102X78 (DRAPES) ×1 IMPLANT
DRAPE UTILITY XL STRL (DRAPES) ×1 IMPLANT
ELECT REM PT RETURN 9FT ADLT (ELECTROSURGICAL) ×1
ELECTRODE REM PT RTRN 9FT ADLT (ELECTROSURGICAL) ×1 IMPLANT
GLOVE BIO SURGEON STRL SZ7 (GLOVE) IMPLANT
GLOVE BIO SURGEON STRL SZ7.5 (GLOVE) ×1 IMPLANT
GLOVE INDICATOR 7.0 STRL GRN (GLOVE) IMPLANT
GOWN STRL REUS W/ TWL LRG LVL3 (GOWN DISPOSABLE) ×2 IMPLANT
GOWN STRL REUS W/TWL LRG LVL3 (GOWN DISPOSABLE) ×2
MESH ULTRAPRO 3X6 7.6X15CM (Mesh General) IMPLANT
NDL HYPO 25X1 1.5 SAFETY (NEEDLE) ×1 IMPLANT
NEEDLE HYPO 25X1 1.5 SAFETY (NEEDLE) ×1 IMPLANT
NS IRRIG 1000ML POUR BTL (IV SOLUTION) ×1 IMPLANT
PACK BASIN DAY SURGERY FS (CUSTOM PROCEDURE TRAY) ×1 IMPLANT
PENCIL SMOKE EVACUATOR (MISCELLANEOUS) ×1 IMPLANT
SLEEVE SCD COMPRESS KNEE MED (STOCKING) ×1 IMPLANT
SPIKE FLUID TRANSFER (MISCELLANEOUS) IMPLANT
SPONGE T-LAP 18X18 ~~LOC~~+RFID (SPONGE) ×1 IMPLANT
STRIP CLOSURE SKIN 1/2X4 (GAUZE/BANDAGES/DRESSINGS) IMPLANT
SUT MON AB 4-0 PC3 18 (SUTURE) ×1 IMPLANT
SUT PROLENE 2 0 SH DA (SUTURE) ×2 IMPLANT
SUT SILK 2 0 SH (SUTURE) IMPLANT
SUT SILK 3 0 SH 30 (SUTURE) IMPLANT
SUT SILK 3 0 TIES 17X18 (SUTURE) ×1
SUT SILK 3-0 18XBRD TIE BLK (SUTURE) ×1 IMPLANT
SUT VIC AB 0 CT1 27 (SUTURE)
SUT VIC AB 0 CT1 27XBRD ANBCTR (SUTURE) IMPLANT
SUT VIC AB 2-0 SH 27 (SUTURE) ×1
SUT VIC AB 2-0 SH 27XBRD (SUTURE) ×1 IMPLANT
SUT VIC AB 3-0 SH 27 (SUTURE) ×1
SUT VIC AB 3-0 SH 27X BRD (SUTURE) ×1 IMPLANT
SUT VICRYL 0 SH 27 (SUTURE) IMPLANT
SYR CONTROL 10ML LL (SYRINGE) ×1 IMPLANT
TOWEL GREEN STERILE FF (TOWEL DISPOSABLE) ×2 IMPLANT

## 2022-11-19 NOTE — Op Note (Signed)
11/19/2022  11:10 AM  PATIENT:  Jon Steele  42 y.o. male  PRE-OPERATIVE DIAGNOSIS:  RIGHT INGUINAL HERNIA  POST-OPERATIVE DIAGNOSIS:  RIGHT INGUINAL HERNIA  PROCEDURE:  Procedure(s) with comments: RIGHT HERNIA REPAIR INGUINAL ADULT WITH MESH (Right) - GEN/TAP BLOCK  SURGEON:  Surgeon(s) and Role:    * Griselda Miner, MD - Primary  PHYSICIAN ASSISTANT:   ASSISTANTS: none   ANESTHESIA:   local and general  EBL:  minimal   BLOOD ADMINISTERED:none  DRAINS: none   LOCAL MEDICATIONS USED:  MARCAINE     SPECIMEN:  Source of Specimen:  hernia sac  DISPOSITION OF SPECIMEN:  PATHOLOGY  COUNTS:  YES  TOURNIQUET:  * No tourniquets in log *  DICTATION: .Dragon Dictation  After informed consent was obtained the patient was brought to the operating room and placed in the supine position on the operating table.  After adequate induction of general anesthesia the patient's abdomen and right groin were prepped with ChloraPrep, allowed to dry, and draped in usual sterile manner.  An appropriate timeout was performed.  The right groin area was then infiltrated with quarter percent Marcaine.  A small incision was made from the edge of the pubic tubercle on the right towards the anterior superior iliac spine.  The incision was carried through the skin and subcutaneous tissue sharply with the electrocautery until the fascia of the external oblique was encountered.  A small bridging vein was clamped with hemostats, divided, and ligated with 3-0 silk ties.  The external oblique fascia was opened along its fibers towards the apex of the external ring with a 15 blade knife and Metzenbaum scissors.  A Wheatland retractor was deployed.  Blunt finger dissection was carried out of the cord structures until they could be surrounded between 2 fingers.  Half inch Penrose drain was placed around the cord structures for retraction purposes.  The cord was then gently skeletonized by blunt hemostat dissection.   During the dissection the ilioinguinal nerve was identified and involved with some scar tissue.  It was dissected out proximally and distally, clamped, divided, and ligated with 3-0 silk ties.  The hernia sac was identified with the cord structures.  The hernia sac was gently dissected away from the cord structures by blunt hemostat dissection.  Care was taken to avoid any injury to the vas deferens and testicular vessels.  The hernia sac was then opened and there were no visceral contents within the sac.  The sac was ligated near its base with a 2-0 silk suture ligature.  The distal sac was excised.  The stump of the sac was allowed to retract back beneath the transversalis muscle.  Next a 3 x 6 piece of ultra Pro mesh was chosen and cut to the appropriate size.  The mesh was sewed inferiorly to the shelving edge of the inguinal ligament with a running 2-0 Prolene stitch.  Tails were cut in the mesh laterally and the tails were wrapped around the cord structures.  Medially and superiorly the mesh was sewed to the muscular aponeurotic strength layer of the transversalis with interrupted 2-0 Prolene vertical mattress stitches.  Lateral to the cord the tails of the mesh were anchored to the shelving edge of the inguinal ligament with interrupted 2-0 Prolene stitches.  Once this was accomplished the mesh appeared to be in good position and the hernia seem well repaired.  The wound was irrigated with copious amounts of saline.  The external oblique fascia was then reapproximated with  a running 2-0 Vicryl stitch.  The wound was then infiltrated with more quarter percent Marcaine.  The subcutaneous fascia was closed with a running 3-0 Vicryl stitch.  The skin was then closed with a running 4-0 Monocryl subcuticular stitch.  Dermabond dressings were applied.  The patient tolerated the procedure well.  At the end of the case all needle sponge and instrument counts were correct.  The patient was then awakened and taken to  recovery in stable condition.  The patient's testicle was in his scrotum at the end of the case.  PLAN OF CARE: Discharge to home after PACU  PATIENT DISPOSITION:  PACU - hemodynamically stable.   Delay start of Pharmacological VTE agent (>24hrs) due to surgical blood loss or risk of bleeding: not applicable

## 2022-11-19 NOTE — Interval H&P Note (Signed)
History and Physical Interval Note:  11/19/2022 9:26 AM  Jon Steele  has presented today for surgery, with the diagnosis of RIGHT INGUINAL HERNIA.  The various methods of treatment have been discussed with the patient and family. After consideration of risks, benefits and other options for treatment, the patient has consented to  Procedure(s) with comments: RIGHT HERNIA REPAIR INGUINAL ADULT WITH MESH (Right) - GEN/TAP BLOCK as a surgical intervention.  The patient's history has been reviewed, patient examined, no change in status, stable for surgery.  I have reviewed the patient's chart and labs.  Questions were answered to the patient's satisfaction.     [name]

## 2022-11-19 NOTE — Anesthesia Procedure Notes (Signed)
Anesthesia Regional Block: TAP block   Pre-Anesthetic Checklist: , timeout performed,  Correct Patient, Correct Site, Correct Laterality,  Correct Procedure, Correct Position, site marked,  Risks and benefits discussed,  Pre-op evaluation,  At surgeon's request and post-op pain management  Laterality: Right  Prep: Maximum Sterile Barrier Precautions used, chloraprep       Needles:  Injection technique: Single-shot  Needle Type: Echogenic Stimulator Needle     Needle Length: 9cm  Needle Gauge: 22     Additional Needles:   Procedures:,,,, ultrasound used (permanent image in chart),,    Narrative:  Start time: 11/19/2022 9:32 AM End time: 11/19/2022 9:35 AM Injection made incrementally with aspirations every 5 mL.  Performed by: Personally  Anesthesiologist: Kaylyn Layer, MD  Additional Notes: Risks, benefits, and alternative discussed. Patient gave consent for procedure. Patient prepped and draped in sterile fashion. Sedation administered, patient remains easily responsive to voice. Relevant anatomy identified with ultrasound guidance. Local anesthetic given in 5cc increments with no signs or symptoms of intravascular injection. No pain or paraesthesias with injection. Patient monitored throughout procedure with signs of LAST or immediate complications. Tolerated well. Ultrasound image placed in chart.  Amalia Greenhouse, MD

## 2022-11-19 NOTE — Anesthesia Postprocedure Evaluation (Signed)
Anesthesia Post Note  Patient: Jon Steele  Procedure(s) Performed: RIGHT HERNIA REPAIR INGUINAL ADULT WITH MESH (Right: Inguinal)     Patient location during evaluation: PACU Anesthesia Type: General Level of consciousness: awake and alert Pain management: pain level controlled Vital Signs Assessment: post-procedure vital signs reviewed and stable Respiratory status: spontaneous breathing, nonlabored ventilation and respiratory function stable Cardiovascular status: blood pressure returned to baseline Postop Assessment: no apparent nausea or vomiting Anesthetic complications: no   No notable events documented.  Last Vitals:  Vitals:   11/19/22 0945 11/19/22 1125  BP: 127/80 127/70  Pulse: 77 84  Resp: 14 13  Temp:  36.8 C  SpO2: 99% 95%    Last Pain:  Vitals:   11/19/22 1156  TempSrc:   PainSc: 0-No pain                 Shanda Howells

## 2022-11-19 NOTE — Transfer of Care (Signed)
Immediate Anesthesia Transfer of Care Note  Patient: Jon Steele  Procedure(s) Performed: RIGHT HERNIA REPAIR INGUINAL ADULT WITH MESH (Right: Inguinal)  Patient Location: PACU  Anesthesia Type:GA combined with regional for post-op pain  Level of Consciousness: sedated  Airway & Oxygen Therapy: Patient Spontanous Breathing and Patient connected to face mask oxygen  Post-op Assessment: Report given to RN and Post -op Vital signs reviewed and stable  Post vital signs: Reviewed and stable  Last Vitals:  Vitals Value Taken Time  BP    Temp    Pulse    Resp    SpO2      Last Pain:  Vitals:   11/19/22 0840  TempSrc: Oral  PainSc: 0-No pain         Complications: No notable events documented.

## 2022-11-19 NOTE — Progress Notes (Signed)
Assisted Dr. Howze with right, transabdominal plane, ultrasound guided block. Side rails up, monitors on throughout procedure. See vital signs in flow sheet. Tolerated Procedure well. 

## 2022-11-19 NOTE — Discharge Instructions (Addendum)
Regional Anesthesia Blocks  1. Numbness or the inability to move the "blocked" extremity may last from 3-48 hours after placement. The length of time depends on the medication injected and your individual response to the medication. If the numbness is not going away after 48 hours, call your surgeon.  2. The extremity that is blocked will need to be protected until the numbness is gone and the  Strength has returned. Because you cannot feel it, you will need to take extra care to avoid injury. Because it may be weak, you may have difficulty moving it or using it. You may not know what position it is in without looking at it while the block is in effect.  3. For blocks in the legs and feet, returning to weight bearing and walking needs to be done carefully. You will need to wait until the numbness is entirely gone and the strength has returned. You should be able to move your leg and foot normally before you try and bear weight or walk. You will need someone to be with you when you first try to ensure you do not fall and possibly risk injury.  4. Bruising and tenderness at the needle site are common side effects and will resolve in a few days.  5. Persistent numbness or new problems with movement should be communicated to the surgeon or the [facility] [phone] [facility] [phone]). Post Anesthesia Home Care Instructions  Activity: Get plenty of rest for the remainder of the day. A responsible individual must stay with you for 24 hours following the procedure.  For the next 24 hours, DO NOT: -Drive a car -Advertising copywriter -Drink alcoholic beverages -Take any medication unless instructed by your physician -Make any legal decisions or sign important papers.  Meals: Start with liquid foods such as gelatin or soup. Progress to regular foods as tolerated. Avoid greasy, spicy, heavy foods. If nausea and/or vomiting occur, drink only clear liquids until the  nausea and/or vomiting subsides. Call your physician if vomiting continues.  Special Instructions/Symptoms: Your throat may feel dry or sore from the anesthesia or the breathing tube placed in your throat during surgery. If this causes discomfort, gargle with warm salt water. The discomfort should disappear within 24 hours.  If you had a scopolamine patch placed behind your ear for the management of post- operative nausea and/or vomiting:  1. The medication in the patch is effective for 72 hours, after which it should be removed.  Wrap patch in a tissue and discard in the trash. Wash hands thoroughly with soap and water. 2. You may remove the patch earlier than 72 hours if you experience unpleasant side effects which may include dry mouth, dizziness or visual disturbances. 3. Avoid touching the patch. Wash your hands with soap and water after contact with the patch.     No Tylenol before 2:45pm today. No Ibuprofen/NSAIDS before 4:45pm today.

## 2022-11-19 NOTE — H&P (Signed)
REFERRING PHYSICIAN: Andrez Grime, MD PROVIDER: Lindell Noe, MD MRN: [id number] DOB: 25-Mar-1981 Subjective   Chief Complaint: New Consultation (Inguinal Hernia - c/o pain when coughing. )  History of Present Illness: Jon Steele is a 42 y.o. male who is seen today as an office consultation for evaluation of New Consultation (Inguinal Hernia - c/o pain when coughing. )  We are asked to see the patient in consultation by Dr. Nadene Rubins to evaluate him for a right inguinal hernia. The patient is a 42 year old white male who started to notice a bulge in the right groin a little over a month ago. He does not remember a particular event that started it. He does have some occasional discomfort associated with it. He denies any nausea or vomiting. He denies any fevers or chills. He is otherwise in good health. He quit smoking in February.  Review of Systems: A complete review of systems was obtained from the patient. I have reviewed this information and discussed as appropriate with the patient. See HPI as well for other ROS.  ROS   Medical History: Past Medical History:  Diagnosis Date  Arthritis  Migraine headache   Patient Active Problem List  Diagnosis  Headache disorder  Imbalance  Non-recurrent unilateral inguinal hernia without obstruction or gangrene   History reviewed. No pertinent surgical history.   No Known Allergies  Current Outpatient Medications on File Prior to Visit  Medication Sig Dispense Refill  nortriptyline (PAMELOR) 10 MG capsule Take 2 capsules (20 mg total) by mouth nightly 60 capsule 3   No current facility-administered medications on file prior to visit.   Family History  Problem Relation Age of Onset  Hyperlipidemia (Elevated cholesterol) Father  Diabetes Maternal Grandmother  Parkinsonism Paternal Grandmother  Stroke Paternal Grandmother  Tremor Paternal Grandmother    Social History   Tobacco Use  Smoking Status Former   Packs/day: 1.00  Years: 14.00  Additional pack years: 0.00  Total pack years: 14.00  Types: Cigarettes  Quit date: 08/2022  Years since quitting: 0.1  Smokeless Tobacco Never    Social History   Socioeconomic History  Marital status: Single  Tobacco Use  Smoking status: Former  Packs/day: 1.00  Years: 14.00  Additional pack years: 0.00  Total pack years: 14.00  Types: Cigarettes  Quit date: 08/2022  Years since quitting: 0.1  Smokeless tobacco: Never  Substance and Sexual Activity  Alcohol use: Yes  Drug use: No  Sexual activity: Defer   Objective:   Vitals:  BP: 137/85  Pulse: 76  Temp: 36.9 C (98.5 F)  SpO2: 99%  Weight: 70.4 kg (155 lb 3.2 oz)  Height: 177.8 cm ( )  PainSc: 1   Body mass index is 22.27 kg/m.  Physical Exam Constitutional:  General: He is not in acute distress. Appearance: Normal appearance.  HENT:  Head: Normocephalic and atraumatic.  Right Ear: External ear normal.  Left Ear: External ear normal.  Nose: Nose normal.  Mouth/Throat:  Mouth: Mucous membranes are moist.  Pharynx: Oropharynx is clear.  Eyes:  General: No scleral icterus. Extraocular Movements: Extraocular movements intact.  Conjunctiva/sclera: Conjunctivae normal.  Pupils: Pupils are equal, round, and reactive to light.  Cardiovascular:  Rate and Rhythm: Normal rate and regular rhythm.  Pulses: Normal pulses.  Heart sounds: Normal heart sounds.  Pulmonary:  Effort: Pulmonary effort is normal. No respiratory distress.  Breath sounds: Normal breath sounds.  Abdominal:  General: Abdomen is flat. Bowel sounds are normal. There is no distension.  Palpations: Abdomen is soft.  Tenderness: There is no abdominal tenderness.  Genitourinary: Comments: There is a small reducible bulge in the right groin. There is no palpable bulge or impulse with straining in the left groin Musculoskeletal:  General: No swelling or deformity. Normal range of motion.  Cervical  back: Normal range of motion and neck supple. No tenderness.  Skin: General: Skin is warm and dry.  Coloration: Skin is not jaundiced.  Neurological:  General: No focal deficit present.  Mental Status: He is alert and oriented to person, place, and time.  Psychiatric:  Mood and Affect: Mood normal.  Behavior: Behavior normal.     Labs, Imaging and Diagnostic Testing:  Assessment and Plan:   Diagnoses and all orders for this visit:  Non-recurrent unilateral inguinal hernia without obstruction or gangrene   The patient appears to have a small but symptomatic right inguinal hernia. Because of the risk of incarceration and strangulation I feel he would benefit from having this fixed. He would also like to have this done. I have discussed with him in detail the risks and benefits of the operation as well as some of the technical aspects including the risk of chronic pain and the use of mesh and he understands and wishes to proceed.

## 2022-11-19 NOTE — Anesthesia Procedure Notes (Signed)
Procedure Name: Intubation Date/Time: 11/19/2022 10:04 AM  Performed by: Lauralyn Primes, CRNAPre-anesthesia Checklist: Patient identified, Emergency Drugs available, Suction available and Patient being monitored Patient Re-evaluated:Patient Re-evaluated prior to induction Oxygen Delivery Method: Circle system utilized Preoxygenation: Pre-oxygenation with 100% oxygen Induction Type: IV induction Ventilation: Mask ventilation without difficulty Laryngoscope Size: Mac and 4 Grade View: Grade I Tube type: Oral Tube size: 7.5 mm Number of attempts: 1 Airway Equipment and Method: Stylet and Bite block Placement Confirmation: ETT inserted through vocal cords under direct vision, positive ETCO2 and breath sounds checked- equal and bilateral Secured at: 23 cm Tube secured with: Tape Dental Injury: Teeth and Oropharynx as per pre-operative assessment

## 2022-11-20 ENCOUNTER — Encounter (HOSPITAL_BASED_OUTPATIENT_CLINIC_OR_DEPARTMENT_OTHER): Payer: Self-pay | Admitting: General Surgery

## 2022-11-20 LAB — SURGICAL PATHOLOGY

## 2022-11-22 ENCOUNTER — Telehealth: Payer: Self-pay

## 2022-11-22 NOTE — Transitions of Care (Post Inpatient/ED Visit) (Signed)
   11/22/2022  Name: Jon Steele MRN: [id number] DOB: Nov 11, 1980  Today's TOC FU Call Status:    Transition Care Management Follow-up Telephone Call Date of Discharge: 11/19/22 Discharge Facility: [facility]) Type of Discharge: Inpatient Admission How have you been since you were released from the hospital?: Better Any questions or concerns?: No  Items Reviewed: Did you receive and understand the discharge instructions provided?: Yes Medications obtained and verified?:  (Oxycodone) Any new allergies since your discharge?: No Dietary orders reviewed?: Yes Type of Diet Ordered:: light food. no greasy foods  Home Care and Equipment/Supplies: Were Home Health Services Ordered?: NA Any new equipment or medical supplies ordered?: NA  Functional Questionnaire: Do you need assistance with bathing/showering or dressing?: No Do you need assistance with meal preparation?: No Do you need assistance with eating?: No Do you have difficulty maintaining continence: No Do you need assistance with getting out of bed/getting out of a chair/moving?: No Do you have difficulty managing or taking your medications?: No  Follow up appointments reviewed: Specialist Hospital Follow-up appointment confirmed?: Yes Do you need transportation to your follow-up appointment?: No Do you understand care options if your condition(s) worsen?: Yes-patient verbalized understanding    SIGNATURE: Olga Coaster

## 2023-11-21 ENCOUNTER — Encounter: Payer: Self-pay | Admitting: Family Medicine

## 2023-11-21 ENCOUNTER — Ambulatory Visit (INDEPENDENT_AMBULATORY_CARE_PROVIDER_SITE_OTHER): Admitting: Family Medicine

## 2023-11-21 VITALS — BP 116/76 | HR 82 | Temp 98.3°F | Ht 70.0 in | Wt 167.4 lb

## 2023-11-21 DIAGNOSIS — Z0000 Encounter for general adult medical examination without abnormal findings: Secondary | ICD-10-CM | POA: Diagnosis not present

## 2023-11-21 DIAGNOSIS — Z13220 Encounter for screening for lipoid disorders: Secondary | ICD-10-CM | POA: Diagnosis not present

## 2023-11-21 DIAGNOSIS — Z131 Encounter for screening for diabetes mellitus: Secondary | ICD-10-CM

## 2023-11-21 NOTE — Progress Notes (Signed)
 Established Patient Office Visit   Subjective:  Patient ID: Jon Steele, male    DOB: 1981-02-03  Age: 43 y.o. MRN: [id number]  Chief Complaint  Patient presents with   Annual Exam    CPE. Pt is not fasting    HPI Encounter Diagnoses  Name Primary?   Healthcare maintenance Yes   Screening for cholesterol level    Screening for diabetes mellitus    For physical today.  Mostly doing well.  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  Recently has become difficult for him to achieve 7 hours of sleep.  He naps when he comes home from work and then tries to sleep some more just before it is time to go to work.  Hours typically do not have up to 7.  Active at work.  Has regular dental care.  His family is from this area.  He has a significant other who works days.  Recent hernia surgery went well.   Review of Systems  Constitutional: Negative.   HENT: Negative.    Eyes:  Negative for blurred vision, discharge and redness.  Respiratory: Negative.    Cardiovascular: Negative.   Gastrointestinal:  Negative for abdominal pain.  Genitourinary: Negative.   Musculoskeletal: Negative.  Negative for myalgias.  Skin:  Negative for rash.  Neurological:  Negative for tingling, loss of consciousness and weakness.  Endo/Heme/Allergies:  Negative for polydipsia.     Current Outpatient Medications:    Multiple Vitamin (MULTIVITAMIN ADULT PO), Take by mouth daily., Disp: , Rfl:    ibuprofen (ADVIL) 200 MG tablet, Take 200 mg by mouth every 6 (six) hours as needed., Disp: , Rfl:    oxyCODONE  (ROXICODONE ) 5 MG immediate release tablet, Take 1 tablet (5 mg total) by mouth every 6 (six) hours as needed for severe pain., Disp: 15 tablet, Rfl: 0   Objective:     BP 116/76 (Cuff Size: Normal)   Pulse 82   Temp 98.3 F (36.8 C) (Temporal)   Ht 5\' 10"  (1.778 m)   Wt 167 lb 6.4 oz (75.9 kg)   SpO2 97%   BMI 24.02 kg/m    Physical Exam Constitutional:      General: He is not in acute  distress.    Appearance: Normal appearance. He is not ill-appearing, toxic-appearing or diaphoretic.  HENT:     Head: Normocephalic and atraumatic.     Right Ear: Tympanic membrane, ear canal and external ear normal.     Left Ear: Tympanic membrane, ear canal and external ear normal.     Mouth/Throat:     Mouth: Mucous membranes are moist.     Pharynx: Oropharynx is clear. No oropharyngeal exudate or posterior oropharyngeal erythema.  Eyes:     General: No scleral icterus.       Right eye: No discharge.        Left eye: No discharge.     Extraocular Movements: Extraocular movements intact.     Conjunctiva/sclera: Conjunctivae normal.     Pupils: Pupils are equal, round, and reactive to light.  Cardiovascular:     Rate and Rhythm: Normal rate and regular rhythm.  Pulmonary:     Effort: Pulmonary effort is normal. No respiratory distress.     Breath sounds: Normal breath sounds.  Abdominal:     General: Bowel sounds are normal.     Tenderness: There is no abdominal tenderness. There is no guarding.     Hernia: There is no hernia in  the left inguinal area or right inguinal area.  Genitourinary:    Penis: Circumcised. No hypospadias, erythema, tenderness, discharge, swelling or lesions.      Testes:        Right: Mass, tenderness or swelling not present. Right testis is descended.        Left: Mass, tenderness or swelling not present. Left testis is descended.     Epididymis:     Right: Not inflamed or enlarged.     Left: Not inflamed or enlarged.  Musculoskeletal:     Cervical back: No rigidity or tenderness.  Lymphadenopathy:     Lower Body: No right inguinal adenopathy. No left inguinal adenopathy.  Skin:    General: Skin is warm and dry.  Neurological:     Mental Status: He is alert and oriented to person, place, and time.  Psychiatric:        Mood and Affect: Mood normal.        Behavior: Behavior normal.      No results found for any visits on 11/21/23.    The  ASCVD Risk score (Arnett DK, et al., 2019) failed to calculate for the following reasons:   Cannot find a previous HDL lab   Cannot find a previous total cholesterol lab    Assessment & Plan:   Healthcare maintenance -     CBC with Differential/Platelet; Future -     Urinalysis, Routine w reflex microscopic; Future  Screening for cholesterol level -     Comprehensive metabolic panel with GFR; Future -     Lipid panel; Future  Screening for diabetes mellitus -     Comprehensive metabolic panel with GFR; Future -     Hemoglobin A1c; Future    Return in about 1 year (around 11/20/2024), or Please return fasting for today's ordered blood work..  Information given on health maintenance and disease prevention.  He will make an effort to readjust his lifestyle to achieve 7 hours of sleep.  Will return if this does not help.  Tonna Frederic, MD

## 2023-11-27 ENCOUNTER — Other Ambulatory Visit (INDEPENDENT_AMBULATORY_CARE_PROVIDER_SITE_OTHER)

## 2023-11-27 DIAGNOSIS — Z131 Encounter for screening for diabetes mellitus: Secondary | ICD-10-CM | POA: Diagnosis not present

## 2023-11-27 DIAGNOSIS — Z13220 Encounter for screening for lipoid disorders: Secondary | ICD-10-CM | POA: Diagnosis not present

## 2023-11-27 DIAGNOSIS — Z0000 Encounter for general adult medical examination without abnormal findings: Secondary | ICD-10-CM

## 2023-11-27 LAB — COMPREHENSIVE METABOLIC PANEL WITH GFR
ALT: 24 U/L (ref 0–53)
AST: 16 U/L (ref 0–37)
Albumin: 4.9 g/dL (ref 3.5–5.2)
Alkaline Phosphatase: 80 U/L (ref 39–117)
BUN: 14 mg/dL (ref 6–23)
CO2: 30 meq/L (ref 19–32)
Calcium: 9.6 mg/dL (ref 8.4–10.5)
Chloride: 101 meq/L (ref 96–112)
Creatinine, Ser: 0.95 mg/dL (ref 0.40–1.50)
GFR: 98.45 mL/min (ref >60.00)
Glucose, Bld: 91 mg/dL (ref 70–99)
Potassium: 3.9 meq/L (ref 3.5–5.1)
Sodium: 137 meq/L (ref 135–145)
Total Bilirubin: 0.8 mg/dL (ref 0.2–1.2)
Total Protein: 6.8 g/dL (ref 6.0–8.3)

## 2023-11-27 LAB — URINALYSIS, ROUTINE W REFLEX MICROSCOPIC
Bilirubin Urine: NEGATIVE
Hgb urine dipstick: NEGATIVE
Ketones, ur: NEGATIVE
Leukocytes,Ua: NEGATIVE
Nitrite: NEGATIVE
Specific Gravity, Urine: 1.025 (ref 1.000–1.030)
Total Protein, Urine: NEGATIVE
Urine Glucose: NEGATIVE
Urobilinogen, UA: 0.2 (ref 0.0–1.0)
pH: 6 (ref 5.0–8.0)

## 2023-11-27 LAB — CBC WITH DIFFERENTIAL/PLATELET
Basophils Absolute: 0 10*3/uL (ref 0.0–0.1)
Basophils Relative: 0.5 % (ref 0.0–3.0)
Eosinophils Absolute: 0.2 10*3/uL (ref 0.0–0.7)
Eosinophils Relative: 3.8 % (ref 0.0–5.0)
HCT: 45.2 % (ref 39.0–52.0)
Hemoglobin: 15.5 g/dL (ref 13.0–17.0)
Lymphocytes Relative: 26.2 % (ref 12.0–46.0)
Lymphs Abs: 1.4 10*3/uL (ref 0.7–4.0)
MCHC: 34.3 g/dL (ref 30.0–36.0)
MCV: 93.7 fl (ref 78.0–100.0)
Monocytes Absolute: 0.6 10*3/uL (ref 0.1–1.0)
Monocytes Relative: 10.4 % (ref 3.0–12.0)
Neutro Abs: 3.2 10*3/uL (ref 1.4–7.7)
Neutrophils Relative %: 59.1 % (ref 43.0–77.0)
Platelets: 202 10*3/uL (ref 150.0–400.0)
RBC: 4.83 Mil/uL (ref 4.22–5.81)
RDW: 12.9 % (ref 11.5–15.5)
WBC: 5.4 10*3/uL (ref 4.0–10.5)

## 2023-11-27 LAB — LIPID PANEL
Cholesterol: 176 mg/dL (ref 0–200)
HDL: 53.3 mg/dL (ref >39.00)
LDL Cholesterol: 114 mg/dL — ABNORMAL HIGH (ref 0–99)
NonHDL: 122.72
Total CHOL/HDL Ratio: 3
Triglycerides: 42 mg/dL (ref 0.0–149.0)
VLDL: 8.4 mg/dL (ref 0.0–40.0)

## 2023-11-27 LAB — HEMOGLOBIN A1C: Hgb A1c MFr Bld: 5.1 % (ref 4.6–6.5)

## 2024-11-27 ENCOUNTER — Encounter: Admitting: Family Medicine
